# Patient Record
Sex: Male | Born: 1960 | Race: Black or African American | Hispanic: No | Marital: Single | Smoking: Current every day smoker
Health system: Southern US, Community
[De-identification: ages and names within clinical notes are randomized; demographics above are authoritative.]

## PROBLEM LIST (undated history)

## (undated) DIAGNOSIS — I219 Acute myocardial infarction, unspecified: Secondary | ICD-10-CM

## (undated) DIAGNOSIS — R351 Nocturia: Secondary | ICD-10-CM

## (undated) DIAGNOSIS — M1990 Unspecified osteoarthritis, unspecified site: Secondary | ICD-10-CM

## (undated) DIAGNOSIS — C189 Malignant neoplasm of colon, unspecified: Secondary | ICD-10-CM

## (undated) DIAGNOSIS — F32A Depression, unspecified: Secondary | ICD-10-CM

## (undated) DIAGNOSIS — Z973 Presence of spectacles and contact lenses: Secondary | ICD-10-CM

## (undated) DIAGNOSIS — I10 Essential (primary) hypertension: Secondary | ICD-10-CM

## (undated) DIAGNOSIS — A159 Respiratory tuberculosis unspecified: Secondary | ICD-10-CM

## (undated) DIAGNOSIS — Q8500 Neurofibromatosis, unspecified: Secondary | ICD-10-CM

## (undated) DIAGNOSIS — H9190 Unspecified hearing loss, unspecified ear: Secondary | ICD-10-CM

## (undated) DIAGNOSIS — F329 Major depressive disorder, single episode, unspecified: Secondary | ICD-10-CM

## (undated) DIAGNOSIS — N529 Male erectile dysfunction, unspecified: Secondary | ICD-10-CM

## (undated) DIAGNOSIS — H579 Unspecified disorder of eye and adnexa: Secondary | ICD-10-CM

## (undated) DIAGNOSIS — H548 Legal blindness, as defined in USA: Secondary | ICD-10-CM

## (undated) DIAGNOSIS — R296 Repeated falls: Secondary | ICD-10-CM

## (undated) DIAGNOSIS — IMO0001 Reserved for inherently not codable concepts without codable children: Secondary | ICD-10-CM

## (undated) DIAGNOSIS — Z970 Presence of artificial eye: Secondary | ICD-10-CM

## (undated) HISTORY — PX: ABDOMINAL SURGERY: SHX537

## (undated) HISTORY — PX: HERNIA REPAIR: SHX51

## (undated) HISTORY — PX: EYE SURGERY: SHX253

## (undated) HISTORY — DX: Male erectile dysfunction, unspecified: N52.9

## (undated) HISTORY — PX: CARDIAC CATHETERIZATION: SHX172

## (undated) HISTORY — DX: Essential (primary) hypertension: I10

---

## 2002-07-31 ENCOUNTER — Ambulatory Visit (HOSPITAL_COMMUNITY): Admission: RE | Admit: 2002-07-31 | Discharge: 2002-07-31 | Payer: Self-pay | Admitting: Family Medicine

## 2002-07-31 ENCOUNTER — Encounter: Payer: Self-pay | Admitting: Family Medicine

## 2002-11-28 ENCOUNTER — Ambulatory Visit (HOSPITAL_COMMUNITY): Admission: RE | Admit: 2002-11-28 | Discharge: 2002-11-28 | Payer: Self-pay | Admitting: Gastroenterology

## 2003-01-03 ENCOUNTER — Encounter: Payer: Self-pay | Admitting: Family Medicine

## 2003-01-03 ENCOUNTER — Ambulatory Visit (HOSPITAL_COMMUNITY): Admission: RE | Admit: 2003-01-03 | Discharge: 2003-01-03 | Payer: Self-pay | Admitting: Family Medicine

## 2003-05-15 ENCOUNTER — Ambulatory Visit (HOSPITAL_COMMUNITY): Admission: RE | Admit: 2003-05-15 | Discharge: 2003-05-15 | Payer: Self-pay | Admitting: Family Medicine

## 2003-06-06 ENCOUNTER — Ambulatory Visit (HOSPITAL_COMMUNITY): Admission: RE | Admit: 2003-06-06 | Discharge: 2003-06-06 | Payer: Self-pay | Admitting: Family Medicine

## 2003-06-11 ENCOUNTER — Ambulatory Visit (HOSPITAL_COMMUNITY): Admission: RE | Admit: 2003-06-11 | Discharge: 2003-06-11 | Payer: Self-pay | Admitting: Neurosurgery

## 2003-07-21 ENCOUNTER — Encounter (INDEPENDENT_AMBULATORY_CARE_PROVIDER_SITE_OTHER): Payer: Self-pay | Admitting: Specialist

## 2003-07-21 ENCOUNTER — Ambulatory Visit (HOSPITAL_BASED_OUTPATIENT_CLINIC_OR_DEPARTMENT_OTHER): Admission: RE | Admit: 2003-07-21 | Discharge: 2003-07-21 | Payer: Self-pay | Admitting: General Surgery

## 2003-07-21 ENCOUNTER — Ambulatory Visit (HOSPITAL_COMMUNITY): Admission: RE | Admit: 2003-07-21 | Discharge: 2003-07-21 | Payer: Self-pay | Admitting: General Surgery

## 2003-09-16 ENCOUNTER — Ambulatory Visit (HOSPITAL_BASED_OUTPATIENT_CLINIC_OR_DEPARTMENT_OTHER): Admission: RE | Admit: 2003-09-16 | Discharge: 2003-09-16 | Payer: Self-pay | Admitting: General Surgery

## 2003-09-16 ENCOUNTER — Ambulatory Visit (HOSPITAL_COMMUNITY): Admission: RE | Admit: 2003-09-16 | Discharge: 2003-09-16 | Payer: Self-pay | Admitting: General Surgery

## 2003-09-16 ENCOUNTER — Encounter (INDEPENDENT_AMBULATORY_CARE_PROVIDER_SITE_OTHER): Payer: Self-pay | Admitting: Specialist

## 2004-08-16 ENCOUNTER — Inpatient Hospital Stay (HOSPITAL_COMMUNITY): Admission: EM | Admit: 2004-08-16 | Discharge: 2004-08-21 | Payer: Self-pay | Admitting: Emergency Medicine

## 2004-08-17 ENCOUNTER — Encounter (INDEPENDENT_AMBULATORY_CARE_PROVIDER_SITE_OTHER): Payer: Self-pay | Admitting: *Deleted

## 2004-08-25 ENCOUNTER — Encounter: Payer: Self-pay | Admitting: General Surgery

## 2004-08-31 ENCOUNTER — Ambulatory Visit (HOSPITAL_COMMUNITY): Admission: RE | Admit: 2004-08-31 | Discharge: 2004-08-31 | Payer: Self-pay | Admitting: *Deleted

## 2004-10-04 ENCOUNTER — Encounter (HOSPITAL_COMMUNITY): Admission: RE | Admit: 2004-10-04 | Discharge: 2005-01-02 | Payer: Self-pay | Admitting: Endocrinology

## 2004-10-12 ENCOUNTER — Ambulatory Visit (HOSPITAL_COMMUNITY): Admission: RE | Admit: 2004-10-12 | Discharge: 2004-10-12 | Payer: Self-pay | Admitting: Endocrinology

## 2004-11-01 ENCOUNTER — Emergency Department (HOSPITAL_COMMUNITY): Admission: EM | Admit: 2004-11-01 | Discharge: 2004-11-01 | Payer: Self-pay | Admitting: *Deleted

## 2005-02-02 ENCOUNTER — Encounter: Admission: RE | Admit: 2005-02-02 | Discharge: 2005-02-02 | Payer: Self-pay | Admitting: *Deleted

## 2006-09-25 ENCOUNTER — Encounter: Admission: RE | Admit: 2006-09-25 | Discharge: 2006-09-25 | Payer: Self-pay | Admitting: Family Medicine

## 2010-01-26 ENCOUNTER — Ambulatory Visit (HOSPITAL_BASED_OUTPATIENT_CLINIC_OR_DEPARTMENT_OTHER): Admission: RE | Admit: 2010-01-26 | Discharge: 2010-01-26 | Payer: Self-pay | Admitting: Surgery

## 2010-02-19 ENCOUNTER — Ambulatory Visit (HOSPITAL_COMMUNITY): Admission: RE | Admit: 2010-02-19 | Discharge: 2010-02-19 | Payer: Self-pay | Admitting: Ophthalmology

## 2010-09-03 NOTE — Discharge Summary (Signed)
NAME:  Eric Day, Eric Day NO.:  [id number]   MEDICAL RECORD NO.:  [id number]          PATIENT TYPE:  INP   LOCATION:  6715                         FACILITY:  [facility]   PHYSICIAN:  Eric Quarry, MD      DATE OF BIRTH:  09/16/1960   DATE OF ADMISSION:  08/16/2004  DATE OF DISCHARGE:  08/21/2004                                 DISCHARGE SUMMARY   Eric Day is a 50 year old male who initially presented to [facility] on May 1 with sudden onset of severe, right upper quadrant,  abdominal pain associated with shortness of breath. There was no associated  nausea, vomiting, or change in bowel habits. There was no chest pain. There  was no fever, chills, cough, or chest congestion. The patient did not notice  any lower extremity swelling, leg or calf pain. In the emergency room, the  patient was noted to be acute distress. Radiologic studies were performed  which included a chest x-ray that showed evidence of bilateral atelectasis.  A CT scan of the chest was done which showed no filling defects and no  evidence of pulmonary emboli. Bilateral lower lobe atelectasis was noted,  and it was suggested that perhaps the patient had underlying pneumonia. A CT  scan of the abdomen was performed which showed an enhancing complex mass in  the right adrenal gland which did not appear to be a benign adenoma. At the  request of the radiologist, a MRI scan of the abdomen was done which showed  that the right adrenal mass was 5.1 x 3.7 x 3 cm in size with evidence of  edema and hemorrhage which extended under the undersurface of the liver.  Also a 1-cm lesion was identified in the left adrenal gland. Laboratory  studies were obtained which included a CMP which was notable for potassium  of 3.3 and glucose of 183. Serial cardiac enzymes showed that the troponin  level rose progressively to 0.39. The patient was admitted by Dr. Cloyd Stagers-  Day.   PHYSICAL EXAMINATION:  VITAL SIGNS:  On  physical exam, his temperature was  98, blood pressure 150/80, pulse 79, respirations 20.  HEENT:  Within normal limits.  CHEST:  The chest was remarkable for fascicular breath sounds which were  described as decreased at the bases.  CARDIOVASCULAR:  Revealed normal S1 and S2. There were no rubs, murmurs, or  gallops.  ABDOMEN:  Remarkable for marked tenderness in the right upper quadrant area.  There was tenderness to deep palpation. There did not seem to be any  guarding or rebound.  NEUROLOGICAL/EXTREMITIES:  Remarkable for the present of several  neurofibromas.   On admission, the patient was administered pain medication in the form of  morphine by the intravenous root. Potassium supplementation was  administered, and consultation was obtained from Dr. Meade Day of the  cardiology service. She felt that as the patient had evidence of active  hemorrhage that it was not appropriate at this time to proceed with any  cardiac evaluation. She suggested placing the patient on a beta blocker and  monitoring his cardiovascular and blood pressure status closely. Dr. Derrell Day  of the surgical service was consulted in regard to the adrenal mass who  recommended following the patient and did not think that it was appropriate  to intervene surgically at this time. He and I discussed the finding. We  both agreed that the patient needed to have an endocrinology consult in  regard to the adrenal mass.   Subsequently, relevant laboratory studies obtained included an a.m. cortisol  level of 24.8 and urine for catecholamines. This study revealed a  norepinephrine value of 415 mcg per day, an epinephrine value of 162 mcg per  day, and a dopamine value of 286 mcg per day. These results were elevated.  During the course of the patient's hospitalization, I spoke with Dr. Talmage Day  of [facility] who agreed to perform an endocrine consult on the  patient as an outpatient. The patient was  supplied with Dr. Willeen Day number,  and Dr. Talmage Day indicated that she would be sure that the patient got seen in  an expeditious manner. By May 6, the patient was pain free and felt quite  well. He was eager to be discharged. Therefore, the patient was discharged  at that time.   DISCHARGE DIAGNOSES:  1.  History of neurofibromatosis.  2.  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  3.  Abnormal urine catecholamines. Cannot rule out pheochromocytoma.  4.  Bilateral atelectasis. Cannot rule out pneumonia.   PLAN:  At the time of discharge, I advised the patient to continue Avelox  400 mg orally for three additional days. The patient had been receiving  metoprolol 25 mg b.i.d., but I was uncomfortable with continuing this  medication in light of the possibility that the patient had an underlying  pheochromocytoma. I therefore switched him to Cardizem at a dose of 180 mg  daily. I repeated emphasized to the patient the extreme importance of seeing  Dr. Talmage Day in the office for further evaluation.      SY/MEDQ  D:  08/21/2004  T:  08/21/2004  Job:  [id number]   cc:   [facility] Day, M.D.  [address], [place] [address]  Fax: [phone]   [facility], M.D.  [address]  [place]  [place] [address]  Fax: [phone]   [facility], M.D.  [facility].

## 2010-09-03 NOTE — Cardiovascular Report (Signed)
NAMETARAN, HAYNESWORTH NO.:  [id number]   MEDICAL RECORD NO.:  [id number]          PATIENT TYPE:  OIB   LOCATION:  2899                         FACILITY:  [facility]   PHYSICIAN:  Meade Maw, M.D.    DATE OF BIRTH:  May 13, 1960   DATE OF PROCEDURE:  08/31/2004  DATE OF DISCHARGE:                              CARDIAC CATHETERIZATION   REFERRING PHYSICIAN:  Dr. De Nurse.   INDICATIONS FOR PROCEDURE:  Ongoing chest pain, positive troponins, ST  changes noted on the ECG.   PROCEDURE:  After obtaining written informed consent, the patient was  brought to the cardiac catheterization lab in a postabsorptive state.  Preop  sedation was achieved using Versed 10 mg IV.  The right groin was prepped  and draped in the usual sterile fashion.  Local anesthesia was achieved  using 1% Xylocaine.  A 6-French hemostasis sheath was placed into the right  femoral artery using a modified Seldinger technique.  Selective coronary  angiography was performed using a JL4 and JR4 Judkins catheter.  Multiple  views were obtained.  All catheter exchanges were made over a guidewire.  Single-plane ventriculogram was performed in the RAO position using a 6-  French pigtail curved catheter.  Selective coronary angiography was  performed using a JL4 and JR-4 Judkins catheter.  All catheter exchanges  were made over a guidewire.  The hemostasis sheath was flushed following  each catheter exchange.   FINDINGS:  The aortic pressure was 100/62.  LV pressure is 102/8.  EDP is  14.   SINGLE-PLANE VENTRICULOGRAM:  Revealed normal wall motion, ejection fraction  of 65% to 70%.   CORONARY ANGIOGRAPHY:  The left main coronary artery bifurcates into the  left anterior descending and circumflex vessel.  There is no disease noted  in the left main coronary artery.   Left anterior descending:  The left anterior descending gives rise to a  first large diagonal, smaller second and third diagonals, goes  on to end as  an apical branch.  There is no disease noted in the left anterior descending  or its branches.   Circumflex:  The circumflex vessel is a large vessel that gives rise to a  small OM-1, a moderate OM-2, a large bifurcating OM-3.  There is no disease  noted in the circumflex or its branches.   Right coronary artery:  The right coronary artery is dominant for the  posterior circulation and gives rise to 3 RV marginals, a small PDA and  small PLV branch.  There is no disease noted in the right coronary artery or  its branches.   FINAL IMPRESSION:  1.  Normal coronary angiography.  2.  Normal single-plane ventriculogram, ejection fraction of 65%.  3.  ECG changes and elevation in troponins may be secondary to hypertension.      No further cardiac workup is recommended.      HP/MEDQ  D:  08/31/2004  T:  08/31/2004  Job:  [id number]

## 2010-09-03 NOTE — Consult Note (Signed)
NAME:  [facility], Eric Day NO.:  [id number]   MEDICAL RECORD NO.:  [id number]          PATIENT TYPE:  INP   LOCATION:  6715                         FACILITY:  [facility]   PHYSICIAN:  Angelia Mould. Derrell Lolling, M.D.DATE OF BIRTH:  1960/05/16   DATE OF CONSULTATION:  08/17/2004  DATE OF DISCHARGE:                                   CONSULTATION   REASON FOR CONSULTATION:  Evaluate right adrenal mass.   HISTORY OF PRESENT ILLNESS:  This is a 50 year old black man who was in his  usual state of relatively good health when he noted the sudden onset of  right upper quadrant pain Sunday evening, August 15, 2004.  He endured this  for several hours but then came to the [facility]  Emergency Room and was admitted.  He denies any nausea, vomiting, or fever.  He denies prior episodes.  He has been anorexic.  He denies cough or any  substernal chest pain but has had some right lower chest pain and right  upper quadrant pain.  He states that he still has the pain today and is  still anorexic today.   On admission, he had numerous imaging studies including a CT angiogram of  the chest, a CT scan of the abdomen and MRI.  These studies show a 3.8 cm  right adrenal mass with adjacent hemorrhage and some partial compression of  the inferior vena cava.  This is not an extensive hemorrhage, however.  A  smaller, approximately 1.0 cm, left adrenal mass is also noted.  The mass in  the right adrenal is thought to be enhancing on the studies.  There is no  evidence of any pulmonary embolism.   Patient also has been noted to have elevated cardiac enzymes and he was  initially placed on Lovenox and heparin but when the CT scan showed some  hemorrhage around the adrenal, that was discontinued.  He also has been  noted to have some borderline hypertension that is not very significant,  thought to be due to pain.   I was asked to see him to assist with decisions regarding work-up of  his  adrenal mass and decisions regarding surgical intervention.   PAST MEDICAL HISTORY:  1.  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      He was told this was a rare tumor and that he was cancer-free.  2.  He has been told he has some type of brain tumor on CT scan but that      this is probably not cancer and does not require surgery.  3.  He has had some cutaneous neurofibromas removed from his back but has      not had any thoracic or abdominal neurofibromas.  4.  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   CURRENT MEDICATIONS:  He does not take any medications at home.   ALLERGIES:  No known drug allergies.   FAMILY HISTORY:  Negative for heart disease.   SOCIAL HISTORY:  He smokes a  half pack of cigarettes per day.  Denies the  use of alcohol.  Single.  His mother is his closest relative.  He attended  special education high school.   REVIEW OF SYMPTOMS:  All systems reviewed.  They were noncontributory except  that as described above.   PHYSICAL EXAMINATION:  GENERAL APPEARANCE:  A middle-aged black man in mild  distress.  Very flattened affect.  Slow conversationalist but cooperative  and appropriate.  VITAL SIGNS:  Temperature 100.0, blood pressure 124/65, heart rate 84,  respiratory rate 18, oxygen saturation 94% on room air.  Weight 188 pounds.  HEENT:  Eyes:  He has a false right eye.  Extraocular movements of the left  eye are normal.  There is no exophthalmus.  Ears, nose, mouth, throat, lips,  tongue and oropharynx are without gross lesions.  NECK:  Supple and nontender.  No mass, no jugular venous distension.  CARDIOVASCULAR:  Regular rate and rhythm, no murmurs noted.  ABDOMEN:  Slight obesity.  Well-healed midline scar.  No hernias.  He is  tender in the right upper quadrant but with minimal guarding, no rebound.  The rest of the abdomen is soft and nontender.  Liver and spleen are not  obvious enlarged.   No hernia or mass.  EXTREMITIES:  He moves all four extremities well without pain or deformity.  There is no peripheral edema.   LABORATORY DATA:  Hemoglobin 15.9, Bralley blood cell count 15,600. Sodium  141, potassium 5.2, BUN 7, creatinine 1.2, glucose 122. Liver function tests  normal.  Lipase 29.  INR 1.1.   ASSESSMENT:  1.  Acute abdominal pain secondary to spontaneous hemorrhage within a 3.8 cm      enhancing right adrenal mass.  2.  A 1.0 cm left adrenal mass.  3.  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  4.  History of cutaneous neurofibromatosis.  5.  Abnormal cardiac enzymes.  6.  Question of right middle lobe pneumonia.   PLAN:  1.  At this point in time, there is no indication for emergent surgical      intervention. This may be required at a later date.  2.  I would proceed with obtaining an endocrine consult now to begin to      contemplate future work-up.  He will ultimately need blood and urine      studies and an MIBG scan to rule out pheochromocytoma or other      hormonally active tumors.  I would hold off on these tests for three to      four weeks until the hemorrhage and pain had resolved.  3.  Other considerations would be metastatic gastric cancer or lipid poor      adenoma.  If his endocrine work-up is negative for secretory tumor, then      a needle biopsy could be considered at a later date.   I will obtain records of his gastrectomy in 1999.   I will follow along with you with this interesting case.      HMI/MEDQ  D:  08/17/2004  T:  08/17/2004  Job:  [id number]   cc:   Deatra Shantanu, M.D.  Fax: [phone]   Meade Maw, M.D.  [address]  [place]  [place] [address]  Fax: [phone]

## 2010-09-03 NOTE — Op Note (Signed)
   NAME:  [facility], Eric Day                           ACCOUNT NO.:  [id number]   MEDICAL RECORD NO.:  [id number]                   PATIENT TYPE:  AMB   LOCATION:  ENDO                                 FACILITY:  [facility]   PHYSICIAN:  John C. Madilyn Fireman, M.D.                 DATE OF BIRTH:  1960-06-01   DATE OF PROCEDURE:  11/28/2002  DATE OF DISCHARGE:                                 OPERATIVE REPORT   PROCEDURE:  Colonoscopy.   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   DESCRIPTION OF PROCEDURE:  The patient was placed in the left lateral  decubitus position and placed on the pulse monitor with continuous low-flow  oxygen delivered by nasal cannula.  He was sedated with 60 mcg IV fentanyl  and 8 mg IV Versed.  The Olympus video colonoscope was inserted into the  rectum and advanced easily to a surgical ileocolonic anastomosis in the  right abdomen.  The anastomosis appeared to be an end-to-end anastomosis,  and it was intact with no visible suspicion of neoplasm or significant  inflammatory process.  The remaining right colon distal to the anastomosis  as well as the transverse, descending, sigmoid, and rectum appeared normal  down to the anus, where retroflexed view did reveal some small internal  hemorrhoids with no stigma of hemorrhage.  The scope was then withdrawn and  the patient returned to the recovery room in stable condition.  He tolerated  the procedure well, and there were no immediate complications.   IMPRESSION:  Internal hemorrhoids, otherwise basically normal colonoscopy  status post previous right colon resection.   PLAN:  Treat hemorrhoids symptomatically.                                               John C. Madilyn Fireman, M.D.    JCH/MEDQ  D:  11/28/2002  T:  11/28/2002  Job:  [id number]   cc:    Meredith Staggers, M.D.  [address]  [place]  [place] [address]  Fax: [phone]

## 2010-09-03 NOTE — H&P (Signed)
NAME:  [facility], Eric Day NO.:  [id number]   MEDICAL RECORD NO.:  [id number]          PATIENT TYPE:  EMS   LOCATION:  MAJO                         FACILITY:  [facility]   PHYSICIAN:  Jackie Plum, M.D.DATE OF BIRTH:  06-Mar-1961   DATE OF ADMISSION:  08/16/2004  DATE OF DISCHARGE:                                HISTORY & PHYSICAL   CHIEF COMPLAINT:  Dyspnea.   HISTORY OF PRESENT ILLNESS:  Patient is a 50 year old African-American  gentleman with no previous history of diabetes mellitus, hypertension, or  dyslipidemia who presents with above complaint which started last night.  Patient indicates that he was in usual state of health until some time last  night when he started to experience sharp right upper quadrant abdominal  pain with shortness of breath.  The pain in his abdomen does not radiate.  There is no associated nausea, vomiting, diarrhea, constipation.  He has not  been drinking alcohol.  He does not have any chest pain.  He denies any  fever or chills.  He denies any cough or sputum production, PND, orthopnea,  lower extremity swelling, leg or calf pain.  Does not have any dysuria,  frequency, or micturition.  In the emergency room patient had a CT scan of  the abdomen done which was notable for a 3.8 x 2.8 x 4.2 cm right adrenal  enhancing mass, questionable carcinoma.  They recommended follow-up MRI for  further evaluation.  We are therefore asked evaluate the patient for  admission.   PAST MEDICAL HISTORY:  1.  A brain tumor.  2.  Neurofibromatosis.  3.  Internal hemorrhoids.   MEDICATIONS:  Patient is not on any medications at this moment.   ALLERGIES:  Does not have any medication allergies.   FAMILY HISTORY:  Negative for heart disease.   SOCIAL HISTORY:  Patient smokes half a pack of cigarettes daily and does not  drink alcohol.   REVIEW OF SYSTEMS:  Significant positives and negatives as in HPI, otherwise  unremarkable.   PHYSICAL  EXAMINATION:  VITAL SIGNS:  Temperature 98.0, blood pressure  152/80, pulse 79, respirations 20, O2 saturation on 95% on 2-3 L oxygen by  nasal cannula.  GENERAL:  He was in acute painful distress, moderate to severe.  HEENT:  Normocephalic, atraumatic.  Pupils are equal, round, and reactive to  light.  Extraocular movements intact.  Oropharynx moist.  No exudate or  erythema.  NECK:  Supple.  No JVD.  LUNGS:  Vesicular breath sounds which were mildly decreased at the bases.  No crackles or wheezes appreciated.  CARDIAC:  Regular rate and rhythm.  No gallops or murmur.  ABDOMEN:  Patient was guarding in the right upper quadrant and right  subcostal area on palpation.  Could not palpate any masses.  Bowel sounds  were present, normoactive.  He seemed to have some tenderness in that area.  EXTREMITIES:  No cyanosis.  No edema.  CNS:  Patient is alert, oriented x3.  No focal deficits.  SKIN:  He has some numerous skin tags __________ lesions reminiscent of  neurofibromatosis.  IMPRESSION:  1.  Dyspnea.  2.  Abdominal pain/adrenal mass.  3.  Hypokalemia.   Patient will be admitted.  Will get CT scan of the chest to rule out PE.  In  view of patient's acute presentation with dyspnea, I am not sure if this is  related to the adrenal mass or pain causing subjective feeling of dyspnea.  Also, obtain an ABG on this gentleman for further gentleman and will get  follow-up MRI of adrenal mass for better characterization of this lesion.  He may need __________ /endocrine consultation.  We will repeat his  potassium.  Will get a 12-lead EKG and will cycle his enzymes.  Further  recommendations will be made __________ .      GO/MEDQ  D:  08/16/2004  T:  08/16/2004  Job:  [id number]   cc:   Deatra Jarek, M.D.  Fax: [phone]

## 2010-09-03 NOTE — Consult Note (Signed)
[name], [name] NO.:  [id number]   MEDICAL RECORD NO.:  [id number]          PATIENT TYPE:  INP   LOCATION:  6715                         FACILITY:  [facility]   PHYSICIAN:  Meade Maw, M.D.    DATE OF BIRTH:  1960-11-13   DATE OF CONSULTATION:  DATE OF DISCHARGE:                                   CONSULTATION   CONSULTING PHYSICIAN:  Meade Maw, M.D.   REFERRING PHYSICIAN:  Jackie Plum, M.D.   INDICATION FOR CONSULT:  Abnormal cardiac enzymes.   HISTORY:  Eric Day is a 50 year old disabled male who was admitted, on  Aug 16, 2004, for ongoing abdominal pain described as a right upper quadrant  abdominal pain with increasing shortness of breath.  The pain in his abdomen  at the time of admission did not radiate.  There was no associated nausea,  vomiting, diarrhea, or constipation.  There has been no history of alcohol  use.  There was no sputum production.  No fevers.  No pain in his legs.  He  does note that he has been having exertional dyspnea for several months and  the dyspnea has actually prohibited him from climbing stairs.  Since  admission, his ECG has revealed a sinus rhythm to sinus tach.  His initial  ECG revealed 0.5-mm to 1-mm upsloping ST depression in V4-V6.  This has  resolved with no further ST changes noted on Aug 17, 2004.  M.D. reports his  initial cardiac enzymes were negative.  He has subsequently developed an  increase in his troponin I with a maximum troponin I of 0.39 noted.  The  patient was initiated on heparin secondary to ST changes and increase in his  troponin I, and he subsequently developed tachycardia, diaphoresis, and  chest pain.  His MRA revealed bilateral adrenal lesions with the right  greater than left, and the right adrenal gland had hemorrhagic changes  noted.  This extended to the undersurface of the liver and the upper aspect  of the right kidney.  A CT scan of the chest for a pulmonary embolus has  been negative.   PAST MEDICAL HISTORY:  1.  Partial gastrectomy for a rare malignancy in [place], [place] in 1999.  2.  Brain tumor on CT scan.  3.  Cutaneous neurofibromas.  4.  Right eye enucleation.  5.  Internal hemorrhoids.   MEDICATIONS PRIOR TO ADMISSION:  None.   CURRENT MEDICATIONS:  Morphine sulfate p.r.n., Zofran p.r.n., Ritalin p.r.n.   He has no known drug allergies.   FAMILY HISTORY:  Negative for coronary artery disease.  Family history  otherwise negative, the patient is unsure of his family history.   SOCIAL HISTORY:  The patient smokes approximately one half pack of  cigarettes daily.  No history of alcohol use.  He is single.  His mother is  his closest relative.  He has a special education in high school.   PHYSICAL EXAMINATION:  GENERAL:  Reveals a middle-aged male in mild distress  from his pain.  He was sedated at the time of my initial exam.  VITAL SIGNS:  He is afebrile.  Systolic pressure is 150, diastolic 50.  T-  max has been 100.  His heart rate has been elevated to 130s with ambulation  and pain.  NEURO:  Nonfocal.  HEENT:  Unremarkable.  Good carotid upstrokes.  No neck vein distention  noted.  CARDIOVASCULAR:  Reveals a regular rate and rhythm.  Normal S1.  Normal S2  with no rubs, murmurs, or gallops noted.  ABDOMEN:  Tender to palpation.  EXTREMITIES:  Reveal no peripheral edema.   LABORATORY DATA:  As noted above.   IMPRESSION:  1.  Chest pain with abnormal troponin.  His chest pain is atypical for      cardiac.  He is noted to have ST depression on his initial      electrocardiogram of Aug 16, 2004, when he was noted to have a rapid      heart rate.  History would be suggestive somewhat of ischemia.  He does      have coronary risk factors including male sex and tobacco use.      Cholesterol status is unknown.  At this time, we will proceed with a      transthroacic echocardiogram for further evaluation.  I hesitate to      proceed with  Cardiolite at this point as we would be unable to act upon      the results.  We would be unable to provide him with anticoagulation for      any type of percutaneous revascularization.  Should it be determined      that he needs surgery, could proceed with an adenosine Cardiolite to      further risk stratify but again with the notation of knowing that we      would be unable to act on the results.  At this time, I would add beta-      blockers for both hypertension as well as rate control.  He needs close      attention to pain control.  We will add a BNP to his current labs and      continue with serial ECGs for further evaluation.  2.  Hypertension.  Elevation most likely secondary to his pain.   Thank you for allowing me to assist in the care of your patient.  I will  discuss the patient further with you.      HP/MEDQ  D:  08/17/2004  T:  08/17/2004  Job:  [id number]

## 2010-09-03 NOTE — Op Note (Signed)
NAME:  Eric Day, Eric Day                           ACCOUNT NO.:  [id number]   MEDICAL RECORD NO.:  [id number]                   PATIENT TYPE:  AMB   LOCATION:  DSC                                  FACILITY:  [facility]   PHYSICIAN:  Ollen Gross. Vernell Morgans, M.D.              DATE OF BIRTH:  11-17-1960   DATE OF PROCEDURE:  09/16/2003  DATE OF DISCHARGE:                                 OPERATIVE REPORT   PREOPERATIVE DIAGNOSIS:  Neurofibromas.   POSTOPERATIVE DIAGNOSIS:  Neurofibromas.   PROCEDURE:  Excision of 13 neurofibromas from the back, each approximately 5  mm.   SURGEON:  Ollen Gross. Carolynne Edouard, M.D.   DESCRIPTION OF PROCEDURE:  After informed consent was obtained the patient  was brought to the operating room and placed in the prone position on the  operating table.  The left side of his neck was prepped with Betadine and  draped in the usual sterile manner.  Each of these areas was infiltrated  with 1% lidocaine with epinephrine and an 11 blade knife was used to excise  each of these areas. Each area was then closed with interrupted 4-0 Prolene  stitch.  Neosporin and Band-Aids were then applied.  The patient tolerated  the procedure well. At the end of the case, all needle, sponge and  instrument counts were correct.  The patient was then awakened and taken to  the recovery room in stable condition.                                               Ollen Gross. Vernell Morgans, M.D.    PST/MEDQ  D:  09/16/2003  T:  09/16/2003  Job:  [id number]
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
## 2010-11-02 ENCOUNTER — Other Ambulatory Visit (HOSPITAL_COMMUNITY): Payer: Self-pay | Admitting: Family Medicine

## 2010-11-02 DIAGNOSIS — G8929 Other chronic pain: Secondary | ICD-10-CM

## 2010-11-02 DIAGNOSIS — Z85038 Personal history of other malignant neoplasm of large intestine: Secondary | ICD-10-CM

## 2010-11-02 DIAGNOSIS — R1011 Right upper quadrant pain: Secondary | ICD-10-CM

## 2010-11-05 ENCOUNTER — Encounter (HOSPITAL_COMMUNITY): Payer: Self-pay

## 2010-11-05 ENCOUNTER — Ambulatory Visit (HOSPITAL_COMMUNITY)
Admission: RE | Admit: 2010-11-05 | Discharge: 2010-11-05 | Disposition: A | Discharge status: Home / Self Care | Payer: PRIVATE HEALTH INSURANCE | Source: Ambulatory Visit | Attending: Family Medicine | Admitting: Family Medicine

## 2010-11-05 DIAGNOSIS — E278 Other specified disorders of adrenal gland: Secondary | ICD-10-CM | POA: Insufficient documentation

## 2010-11-05 DIAGNOSIS — R1011 Right upper quadrant pain: Secondary | ICD-10-CM | POA: Insufficient documentation

## 2010-11-05 DIAGNOSIS — N400 Enlarged prostate without lower urinary tract symptoms: Secondary | ICD-10-CM | POA: Insufficient documentation

## 2010-11-05 DIAGNOSIS — G8929 Other chronic pain: Secondary | ICD-10-CM

## 2010-11-05 DIAGNOSIS — Z85038 Personal history of other malignant neoplasm of large intestine: Secondary | ICD-10-CM

## 2010-11-05 HISTORY — DX: Malignant neoplasm of colon, unspecified: C18.9

## 2010-11-05 MED ORDER — IOHEXOL 300 MG/ML  SOLN
100.0000 mL | Freq: Once | INTRAMUSCULAR | Status: AC | PRN
  Administered 2010-11-05: 100 mL via INTRAVENOUS

## 2010-11-16 ENCOUNTER — Other Ambulatory Visit: Payer: Self-pay | Admitting: Family Medicine

## 2010-11-16 DIAGNOSIS — R935 Abnormal findings on diagnostic imaging of other abdominal regions, including retroperitoneum: Secondary | ICD-10-CM

## 2010-11-24 ENCOUNTER — Ambulatory Visit
Admission: RE | Admit: 2010-11-24 | Discharge: 2010-11-24 | Disposition: A | Discharge status: Home / Self Care | Payer: PRIVATE HEALTH INSURANCE | Source: Ambulatory Visit | Attending: Family Medicine | Admitting: Family Medicine

## 2010-11-24 DIAGNOSIS — R935 Abnormal findings on diagnostic imaging of other abdominal regions, including retroperitoneum: Secondary | ICD-10-CM

## 2010-11-24 MED ORDER — GADOBENATE DIMEGLUMINE 529 MG/ML IV SOLN
20.0000 mL | Freq: Once | INTRAVENOUS | Status: AC | PRN
  Administered 2010-11-24: 20 mL via INTRAVENOUS

## 2012-03-03 ENCOUNTER — Encounter (HOSPITAL_COMMUNITY): Payer: Self-pay

## 2012-03-03 ENCOUNTER — Emergency Department (HOSPITAL_COMMUNITY)
Admission: EM | Admit: 2012-03-03 | Discharge: 2012-03-03 | Disposition: A | Discharge status: Home / Self Care | Payer: PRIVATE HEALTH INSURANCE | Attending: Emergency Medicine | Admitting: Emergency Medicine

## 2012-03-03 ENCOUNTER — Emergency Department (HOSPITAL_COMMUNITY): Payer: PRIVATE HEALTH INSURANCE

## 2012-03-03 DIAGNOSIS — S025XXA Fracture of tooth (traumatic), initial encounter for closed fracture: Secondary | ICD-10-CM | POA: Insufficient documentation

## 2012-03-03 DIAGNOSIS — Z85038 Personal history of other malignant neoplasm of large intestine: Secondary | ICD-10-CM | POA: Insufficient documentation

## 2012-03-03 DIAGNOSIS — R51 Headache: Secondary | ICD-10-CM | POA: Insufficient documentation

## 2012-03-03 DIAGNOSIS — Z23 Encounter for immunization: Secondary | ICD-10-CM | POA: Insufficient documentation

## 2012-03-03 DIAGNOSIS — S0083XA Contusion of other part of head, initial encounter: Secondary | ICD-10-CM

## 2012-03-03 DIAGNOSIS — S0993XA Unspecified injury of face, initial encounter: Secondary | ICD-10-CM

## 2012-03-03 DIAGNOSIS — S0003XA Contusion of scalp, initial encounter: Secondary | ICD-10-CM | POA: Insufficient documentation

## 2012-03-03 MED ORDER — TETANUS-DIPHTH-ACELL PERTUSSIS 5-2.5-18.5 LF-MCG/0.5 IM SUSP
0.5000 mL | Freq: Once | INTRAMUSCULAR | Status: AC
  Administered 2012-03-03: 0.5 mL via INTRAMUSCULAR
  Filled 2012-03-03: qty 0.5

## 2012-03-03 MED ORDER — IOHEXOL 300 MG/ML  SOLN
80.0000 mL | Freq: Once | INTRAMUSCULAR | Status: AC | PRN
  Administered 2012-03-03: 80 mL via INTRAVENOUS

## 2012-03-03 MED ORDER — HYDROCODONE-ACETAMINOPHEN 5-325 MG PO TABS: 2.0000 | ORAL_TABLET | ORAL | Status: DC | PRN
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
## 2012-03-03 NOTE — ED Notes (Signed)
Rx x 1.  Pt voiced understanding to f/u with dentist and return for worsening condition.
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
## 2013-01-21 ENCOUNTER — Other Ambulatory Visit: Payer: Self-pay | Admitting: Family Medicine

## 2013-01-21 ENCOUNTER — Ambulatory Visit
Admission: RE | Admit: 2013-01-21 | Discharge: 2013-01-21 | Disposition: A | Discharge status: Home / Self Care | Payer: Medicaid Other | Source: Ambulatory Visit | Attending: Family Medicine | Admitting: Family Medicine

## 2013-01-21 DIAGNOSIS — M542 Cervicalgia: Secondary | ICD-10-CM

## 2013-01-21 DIAGNOSIS — M545 Low back pain, unspecified: Secondary | ICD-10-CM

## 2014-06-28 ENCOUNTER — Emergency Department (HOSPITAL_COMMUNITY): Payer: Medicare Other

## 2014-06-28 ENCOUNTER — Encounter (HOSPITAL_COMMUNITY): Payer: Self-pay | Admitting: Family Medicine

## 2014-06-28 ENCOUNTER — Emergency Department (HOSPITAL_COMMUNITY)
Admission: EM | Admit: 2014-06-28 | Discharge: 2014-06-29 | Disposition: A | Discharge status: Home / Self Care | Payer: Medicare Other | Attending: Emergency Medicine | Admitting: Emergency Medicine

## 2014-06-28 DIAGNOSIS — M5412 Radiculopathy, cervical region: Secondary | ICD-10-CM | POA: Diagnosis not present

## 2014-06-28 DIAGNOSIS — Z79899 Other long term (current) drug therapy: Secondary | ICD-10-CM | POA: Insufficient documentation

## 2014-06-28 DIAGNOSIS — R51 Headache: Secondary | ICD-10-CM | POA: Insufficient documentation

## 2014-06-28 DIAGNOSIS — M4802 Spinal stenosis, cervical region: Secondary | ICD-10-CM

## 2014-06-28 DIAGNOSIS — Z85038 Personal history of other malignant neoplasm of large intestine: Secondary | ICD-10-CM | POA: Insufficient documentation

## 2014-06-28 DIAGNOSIS — R200 Anesthesia of skin: Secondary | ICD-10-CM | POA: Diagnosis not present

## 2014-06-28 DIAGNOSIS — R531 Weakness: Secondary | ICD-10-CM | POA: Diagnosis present

## 2014-06-28 DIAGNOSIS — G8220 Paraplegia, unspecified: Secondary | ICD-10-CM

## 2014-06-28 LAB — DIFFERENTIAL
Basophils Absolute: 0 10*3/uL (ref 0.0–0.1)
Basophils Relative: 1 % (ref 0–1)
Eosinophils Absolute: 0.2 10*3/uL (ref 0.0–0.7)
Eosinophils Relative: 2 % (ref 0–5)
Lymphocytes Relative: 34 % (ref 12–46)
Lymphs Abs: 2.7 10*3/uL (ref 0.7–4.0)
MONOS PCT: 10 % (ref 3–12)
Monocytes Absolute: 0.8 10*3/uL (ref 0.1–1.0)
NEUTROS ABS: 4.1 10*3/uL (ref 1.7–7.7)
NEUTROS PCT: 53 % (ref 43–77)

## 2014-06-28 LAB — CBC
HEMATOCRIT: 47.2 % (ref 39.0–52.0)
HEMOGLOBIN: 16.4 g/dL (ref 13.0–17.0)
MCH: 27.9 pg (ref 26.0–34.0)
MCHC: 34.7 g/dL (ref 30.0–36.0)
MCV: 80.4 fL (ref 78.0–100.0)
PLATELETS: 272 10*3/uL (ref 150–400)
RBC: 5.87 MIL/uL — AB (ref 4.22–5.81)
RDW: 14.3 % (ref 11.5–15.5)
WBC: 7.8 10*3/uL (ref 4.0–10.5)

## 2014-06-28 LAB — COMPREHENSIVE METABOLIC PANEL
ALBUMIN: 4 g/dL (ref 3.5–5.2)
ALT: 34 U/L (ref 0–53)
AST: 30 U/L (ref 0–37)
Alkaline Phosphatase: 87 U/L (ref 39–117)
Anion gap: 10 (ref 5–15)
BILIRUBIN TOTAL: 0.7 mg/dL (ref 0.3–1.2)
BUN: 5 mg/dL — AB (ref 6–23)
CO2: 26 mmol/L (ref 19–32)
Calcium: 10 mg/dL (ref 8.4–10.5)
Chloride: 104 mmol/L (ref 96–112)
Creatinine, Ser: 0.97 mg/dL (ref 0.50–1.35)
GFR calc non Af Amer: >90 mL/min (ref >90)
GLUCOSE: 96 mg/dL (ref 70–99)
Potassium: 4 mmol/L (ref 3.5–5.1)
Sodium: 140 mmol/L (ref 135–145)
Total Protein: 6.8 g/dL (ref 6.0–8.3)

## 2014-06-28 LAB — PROTIME-INR
INR: 1.03 (ref 0.00–1.49)
Prothrombin Time: 13.6 seconds (ref 11.6–15.2)

## 2014-06-28 LAB — APTT: APTT: 34 s (ref 24–37)

## 2014-06-28 LAB — I-STAT TROPONIN, ED: Troponin i, poc: 0 ng/mL (ref 0.00–0.08)

## 2014-06-28 MED ORDER — GADOBENATE DIMEGLUMINE 529 MG/ML IV SOLN
18.0000 mL | Freq: Once | INTRAVENOUS | Status: AC | PRN
  Administered 2014-06-28: 18 mL via INTRAVENOUS

## 2014-06-28 MED ORDER — DEXAMETHASONE SODIUM PHOSPHATE 10 MG/ML IJ SOLN
10.0000 mg | Freq: Once | INTRAMUSCULAR | Status: AC
  Administered 2014-06-28: 10 mg via INTRAVENOUS
  Filled 2014-06-28: qty 1

## 2014-06-28 MED ORDER — OXYCODONE-ACETAMINOPHEN 5-325 MG PO TABS: 1.0000 | ORAL_TABLET | ORAL | Status: DC | PRN

## 2014-06-28 MED ORDER — PREDNISONE 50 MG PO TABS: ORAL_TABLET | ORAL | Status: DC

## 2014-06-28 NOTE — ED Provider Notes (Signed)
CSN: [id number]     Arrival date & time 06/28/14  1444 History   First MD Initiated Contact with Patient 06/28/14 1552     Chief Complaint  Patient presents with  . Numbness  . Extremity Weakness     Patient is a 54 y.o. male presenting with extremity weakness. The history is provided by the patient and a relative.  Extremity Weakness This is a new problem. The current episode started more than 2 days ago. The problem occurs daily. The problem has not changed since onset.Associated symptoms include headaches. Pertinent negatives include no chest pain and no abdominal pain. Nothing aggravates the symptoms. Nothing relieves the symptoms.  Patient presents from home with bilateral UE weakness/numbness for a week.  He also reports difficulty walking.  He also reports HA.  No new neck pain.  No CP at this time.   Brother reports pt appears to have slurred speech that was noticed today His brother also feels that he appears unsteady while walking    Past Medical History  Diagnosis Date  . Colon cancer    Past Surgical History  Procedure Laterality Date  . Abdominal surgery    . Eye surgery     History reviewed. No pertinent family history. History  Substance Use Topics  . Smoking status: Never Smoker   . Smokeless tobacco: Not on file  . Alcohol Use: No    Review of Systems  Constitutional: Negative for fever.  Eyes:       Blind at baseline, has prosthetic right eye   Cardiovascular: Negative for chest pain.  Gastrointestinal: Negative for abdominal pain.  Musculoskeletal: Positive for extremity weakness.  Neurological: Positive for weakness, numbness and headaches. Negative for dizziness.  All other systems reviewed and are negative.     Allergies  Review of patient's allergies indicates no known allergies.  Home Medications   Prior to Admission medications   Medication Sig Start Date End Date Taking? Authorizing Provider  HYDROcodone-acetaminophen (NORCO/VICODIN)  5-325 MG per tablet Take 2 tablets by mouth every 4 (four) hours as needed for pain. 03/03/12   Ezequiel Essex, MD  travoprost, benzalkonium, (TRAVATAN) 0.004 % ophthalmic solution Place 1 drop into the left eye 3 (three) times daily.    Historical Provider, MD  traZODone (DESYREL) 100 MG tablet Take 100 mg by mouth at bedtime.    Historical Provider, MD   BP 122/87 mmHg  Pulse 82  Temp(Src) 98.3 F (36.8 C) (Oral)  Resp 21  Ht 5\' 6"  (1.676 m)  Wt 190 lb 4 oz (86.297 kg)  BMI 30.72 kg/m2  SpO2 95% Physical Exam CONSTITUTIONAL: Well developed/well nourished HEAD: Normocephalic/atraumatic EYES: OD prosthetic.   ENMT: Mucous membranes moist NECK: supple no meningeal signs SPINE/BACK:entire spine nontender CV: S1/S2 noted, no murmurs/rubs/gallops noted LUNGS: Lungs are clear to auscultation bilaterally, no apparent distress ABDOMEN: soft, nontender, no rebound or guarding, bowel sounds noted throughout abdomen GU:no cva tenderness NEURO: Pt is awake/alert/appropriate, moves all extremitiesx4.  No facial droop.  No arm/leg drift.  He has decreased grip strength noted bilaterally.  He reports numbness to bilateral forearms.  He has mild ataxia. His speech is fluent EXTREMITIES: pulses normal/equal, full ROM SKIN: warm, color normal, multiple fibromas noted throughout skin PSYCH: no abnormalities of mood noted, alert and oriented to situation  ED Course  Procedures   4:35 PM Pt with numbness, difficulty walking and possible slurred speech for up to a week.  He has h/o neurofibromatosis.  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 Will call nsgy Pt awake/alert, no distress 11:46 PM Dr Saintclair Halsted reviewed imaging Recommends  soft cspine collar Decadron 10mg  IV here Send home with course of steroids and he will see in two days Pt/family agreeable We discussed strict return precautions  Labs Review Labs Reviewed  CBC - Abnormal; Notable for the following:    RBC 5.87 (*)    All other components within normal limits  COMPREHENSIVE METABOLIC PANEL - Abnormal; Notable for the following:    BUN 5 (*)    All other components within normal limits  PROTIME-INR  APTT  DIFFERENTIAL  CBG MONITORING, ED  I-STAT TROPOININ, ED    Imaging Review Ct Head (brain) Wo Contrast  06/28/2014   CLINICAL DATA:  Weakness in both legs started Thursday of last week and bilateral hand numbness  EXAM: CT HEAD WITHOUT CONTRAST  TECHNIQUE: Contiguous axial images were obtained from the base of the skull through the vertex without intravenous contrast.  COMPARISON:  03/03/2012  FINDINGS: Ventricles, cisterns and other CSF spaces are normal. There is no mass, mass effect, shift of midline structures or acute hemorrhage. No evidence of acute infarction. Prosthetic right globe unchanged. Remainder of the exam is unchanged.  IMPRESSION: No acute intracranial findings.   Electronically Signed   By: Marin Olp M.D.   On: 06/28/2014 17:36   Mr Jeri Cos TD Contrast  06/28/2014   CLINICAL DATA:  Progressive lower extremity weakness and difficulty walking for 10 days, predominantly going up stairs. Difficulty with upper extremities, clumsiness. 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  EXAM: MRI HEAD WITHOUT AND WITH CONTRAST  MRI CERVICAL SPINE WITHOUT AND WITH CONTRAST  MRI THORACIC SPINE WITHOUT AND WITH CONTRAST  TECHNIQUE: Multiplanar, multiecho pulse sequences of the brain and surrounding structures, and cervical and thoracic spine, to include the craniocervical junction and cervicothoracic junction, were obtained without and with intravenous contrast.  CONTRAST:  66mL MULTIHANCE GADOBENATE DIMEGLUMINE  529 MG/ML IV SOLN  COMPARISON:  None.  FINDINGS: MRI HEAD FINDINGS  The ventricles and sulci are normal for patient's age. No abnormal parenchymal signal, mass effect. Stable appearance of nonenhancing and RIGHT mesial temporal lobe approximate 15 x 10 mm mass can be seen with myelin vacuolization though typically resolve by adulthood, or fibroma. No abnormal parenchymal enhancement. No reduced diffusion to suggest acute ischemia. Faint subcentimeter T2 hyperintensity within LEFT posterior brachium pontis is unchanged, no associated enhancement. Mild [place] matter changes, similar. No susceptibility artifact to suggest hemorrhage. Is faint intrinsic T1 shortening, associated with susceptibility artifact within the torcula of Herophili extending to the RIGHT transverse sinus is unchanged, and may reflect mineralization or possible fatty lesion.  Moderately motion degraded coronal T1 post gadolinium, severely motion degraded sagittal T1 post gadolinium.  No abnormal extra-axial fluid collections. No extra-axial masses nor leptomeningeal enhancement. Normal major intracranial vascular flow voids seen at the skull base.  Status post RIGHT ocular lens implant. No abnormal sellar expansion. Visualized paranasal sinuses and mastoid air cells are well-aerated. No suspicious calvarial bone marrow signal. No abnormal sellar expansion. Craniocervical junction maintained.  MRI CERVICAL SPINE FINDINGS  Cervical vertebral bodies and posterior elements are intact and aligned with maintenance  of cervical lordosis. Borderline congenital canal narrowing. Intervertebral discs demonstrate normal morphology, decreased T2 signal within all disc consistent with mild desiccation. Mild chronic discogenic endplate changes Q6-7 through C5-6. No STIR signal abnormality to suggest acute osseous process. No abnormal intradiscal enhancement. Mild enhancement about the C3-4 facets associated with effusion and reactive arthropathy.  10 mm segment of  mildly bright T2 signal within the cervical spinal cord at C3-4 associated with canal stenosis. No syrinx. No suspicious cord, leptomeningeal or epidural enhancement. 22 x 21 mm enhancing subcutaneous lesion within the paraspinal soft tissues at the cranial cervical junction most consistent with neurofibroma plaque.  Level by level evaluation (moderate to severely motion degraded axial T1 and T2 sequences limit evaluation.):  C2-3: Annular bulging, uncovertebral hypertrophy and severe facet arthropathy result in mild canal stenosis, severe RIGHT and moderate to severe LEFT neural foraminal narrowing.  C3-4: 2 mm broad-based disc bulge, uncovertebral hypertrophy and severe facet arthropathy result in moderate to severe canal stenosis, AP dimension of the canal is 8 mm wake. Severe RIGHT, moderate to severe LEFT neural foraminal narrowing.  C4-5: 1-2 mm annular bulging, uncovertebral hypertrophy. Severe facet arthropathy results in moderate canal stenosis. Severe RIGHT greater LEFT neural foraminal narrowing.  C5-6: Annular bulging, uncovertebral hypertrophy and moderate to severe RIGHT greater LEFT facet arthropathy result in mild canal stenosis, severe bilateral neural foraminal narrowing.  C6-7: No disc bulge. Uncovertebral hypertrophy and mild facet arthropathy results in moderate to severe neural foraminal narrowing without canal stenosis.  C7-T1: No disc bulge. Uncovertebral hypertrophy and mild facet arthropathy without canal stenosis. Mild neural foraminal narrowing.  MRI THORACIC SPINE FINDINGS  Thoracic vertebral bodies and posterior elements are intact and aligned with maintenance of thoracic kyphosis. Mild congenital canal narrowing on the basis of foreshortened pedicles. Mild upper to mid thoracic epidural lipomatosis. Intervertebral discs demonstrate normal morphology, slight desiccation. No STIR signal to suggest acute osseous process. No abnormal osseous or intradiscal enhancement.  Thoracic spinal  cord appears overall normal morphology and signal characteristics though motion degrades sensitivity for subtle potential parenchymal cord signal abnormality. No abnormal cord, leptomeningeal or epidural enhancement. Better imaged on cervical spine is a LEFT paraspinal subcutaneous fibroma.  Multilevel mild facet arthropathy and ligamentum flavum redundancy without canal stenosis. No definite neural foraminal narrowing the motion degrades sensitivity.  IMPRESSION: MRI HEAD: No acute intracranial process.  Nonspecific [place] matter changes, some of which may reflect sequelae of NF-1 without suspicious enhancement to suggest malignant degeneration.  MRI CERVICAL SPINE:  Motion degraded examination.  Degenerative change of the cervical spine superimposed on borderline congenital canal narrowing results in moderate to severe canal stenosis at C3-4, cord compression with 10 mm segment of nonenhancing cord edema/ pre syrinx.  Moderate canal stenosis at C4-5, mild at C2-3 and C5-6. Neural foraminal narrowings at all cervical levels: Severe from C2-3 through C5-6.  LEFT paraspinal subcutaneous neurofibroma.  MRI THORACIC SPINE:  Motion degraded examination.  Early degenerative change of thoracic spine superimposed on congenital canal narrowing without acute fracture, malalignment or nerve compressive changes. No suspicious thoracic spine enhancement.  Acute findings discussed with and reconfirmed by Dr.Danta Baumgardner on 06/28/2014 at 10:50 pm.   Electronically Signed   By: Elon Alas   On: 06/28/2014 22:54   Mr Cervical Spine W Wo Contrast  06/28/2014   CLINICAL DATA:  Progressive lower extremity weakness and difficulty walking for 10 days, predominantly going up stairs. Difficulty with upper extremities, clumsiness. 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  EXAM: MRI HEAD WITHOUT AND WITH CONTRAST  MRI CERVICAL SPINE WITHOUT AND WITH CONTRAST  MRI THORACIC SPINE WITHOUT  AND WITH CONTRAST  TECHNIQUE: Multiplanar, multiecho pulse sequences of the brain and surrounding structures, and cervical and thoracic spine, to include the craniocervical junction and cervicothoracic junction, were obtained without and with intravenous contrast.  CONTRAST:  16mL MULTIHANCE GADOBENATE DIMEGLUMINE 529 MG/ML IV SOLN  COMPARISON:  None.  FINDINGS: MRI HEAD FINDINGS  The ventricles and sulci are normal for patient's age. No abnormal parenchymal signal, mass effect. Stable appearance of nonenhancing and RIGHT mesial temporal lobe approximate 15 x 10 mm mass can be seen with myelin vacuolization though typically resolve by adulthood, or fibroma. No abnormal parenchymal enhancement. No reduced diffusion to suggest acute ischemia. Faint subcentimeter T2 hyperintensity within LEFT posterior brachium pontis is unchanged, no associated enhancement. Mild [place] matter changes, similar. No susceptibility artifact to suggest hemorrhage. Is faint intrinsic T1 shortening, associated with susceptibility artifact within the torcula of Herophili extending to the RIGHT transverse sinus is unchanged, and may reflect mineralization or possible fatty lesion.  Moderately motion degraded coronal T1 post gadolinium, severely motion degraded sagittal T1 post gadolinium.  No abnormal extra-axial fluid collections. No extra-axial masses nor leptomeningeal enhancement. Normal major intracranial vascular flow voids seen at the skull base.  Status post RIGHT ocular lens implant. No abnormal sellar expansion. Visualized paranasal sinuses and mastoid air cells are well-aerated. No suspicious calvarial bone marrow signal. No abnormal sellar expansion. Craniocervical junction maintained.  MRI CERVICAL SPINE FINDINGS  Cervical vertebral bodies and posterior elements are intact and aligned with maintenance of cervical lordosis. Borderline congenital canal narrowing. Intervertebral discs demonstrate normal morphology, decreased T2 signal  within all disc consistent with mild desiccation. Mild chronic discogenic endplate changes Z6-1 through C5-6. No STIR signal abnormality to suggest acute osseous process. No abnormal intradiscal enhancement. Mild enhancement about the C3-4 facets associated with effusion and reactive arthropathy.  10 mm segment of mildly bright T2 signal within the cervical spinal cord at C3-4 associated with canal stenosis. No syrinx. No suspicious cord, leptomeningeal or epidural enhancement. 22 x 21 mm enhancing subcutaneous lesion within the paraspinal soft tissues at the cranial cervical junction most consistent with neurofibroma plaque.  Level by level evaluation (moderate to severely motion degraded axial T1 and T2 sequences limit evaluation.):  C2-3: Annular bulging, uncovertebral hypertrophy and severe facet arthropathy result in mild canal stenosis, severe RIGHT and moderate to severe LEFT neural foraminal narrowing.  C3-4: 2 mm broad-based disc bulge, uncovertebral hypertrophy and severe facet arthropathy result in moderate to severe canal stenosis, AP dimension of the canal is 8 mm wake. Severe RIGHT, moderate to severe LEFT neural foraminal narrowing.  C4-5: 1-2 mm annular bulging, uncovertebral hypertrophy. Severe facet arthropathy results in moderate canal stenosis. Severe RIGHT greater LEFT neural foraminal narrowing.  C5-6: Annular bulging, uncovertebral hypertrophy and moderate to severe RIGHT greater LEFT facet arthropathy result in mild canal stenosis, severe bilateral neural foraminal narrowing.  C6-7: No disc bulge. Uncovertebral hypertrophy and mild facet arthropathy results in moderate to severe neural foraminal narrowing without canal stenosis.  C7-T1: No disc bulge. Uncovertebral hypertrophy and mild facet arthropathy without canal stenosis. Mild neural foraminal narrowing.  MRI THORACIC SPINE FINDINGS  Thoracic vertebral bodies and posterior elements are intact and aligned with maintenance of thoracic  kyphosis. Mild congenital canal narrowing on the basis of foreshortened pedicles. Mild upper to mid thoracic epidural lipomatosis. Intervertebral discs demonstrate normal morphology, slight  desiccation. No STIR signal to suggest acute osseous process. No abnormal osseous or intradiscal enhancement.  Thoracic spinal cord appears overall normal morphology and signal characteristics though motion degrades sensitivity for subtle potential parenchymal cord signal abnormality. No abnormal cord, leptomeningeal or epidural enhancement. Better imaged on cervical spine is a LEFT paraspinal subcutaneous fibroma.  Multilevel mild facet arthropathy and ligamentum flavum redundancy without canal stenosis. No definite neural foraminal narrowing the motion degrades sensitivity.  IMPRESSION: MRI HEAD: No acute intracranial process.  Nonspecific [place] matter changes, some of which may reflect sequelae of NF-1 without suspicious enhancement to suggest malignant degeneration.  MRI CERVICAL SPINE:  Motion degraded examination.  Degenerative change of the cervical spine superimposed on borderline congenital canal narrowing results in moderate to severe canal stenosis at C3-4, cord compression with 10 mm segment of nonenhancing cord edema/ pre syrinx.  Moderate canal stenosis at C4-5, mild at C2-3 and C5-6. Neural foraminal narrowings at all cervical levels: Severe from C2-3 through C5-6.  LEFT paraspinal subcutaneous neurofibroma.  MRI THORACIC SPINE:  Motion degraded examination.  Early degenerative change of thoracic spine superimposed on congenital canal narrowing without acute fracture, malalignment or nerve compressive changes. No suspicious thoracic spine enhancement.  Acute findings discussed with and reconfirmed by Robbie Louis on 06/28/2014 at 10:50 pm.   Electronically Signed   By: Elon Alas   On: 06/28/2014 22:54   Mr Thoracic Spine W Wo Contrast  06/28/2014   CLINICAL DATA:  Progressive lower extremity  weakness and difficulty walking for 10 days, predominantly going up stairs. Difficulty with upper extremities, clumsiness. 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  EXAM: MRI HEAD WITHOUT AND WITH CONTRAST  MRI CERVICAL SPINE WITHOUT AND WITH CONTRAST  MRI THORACIC SPINE WITHOUT AND WITH CONTRAST  TECHNIQUE: Multiplanar, multiecho pulse sequences of the brain and surrounding structures, and cervical and thoracic spine, to include the craniocervical junction and cervicothoracic junction, were obtained without and with intravenous contrast.  CONTRAST:  73mL MULTIHANCE GADOBENATE DIMEGLUMINE 529 MG/ML IV SOLN  COMPARISON:  None.  FINDINGS: MRI HEAD FINDINGS  The ventricles and sulci are normal for patient's age. No abnormal parenchymal signal, mass effect. Stable appearance of nonenhancing and RIGHT mesial temporal lobe approximate 15 x 10 mm mass can be seen with myelin vacuolization though typically resolve by adulthood, or fibroma. No abnormal parenchymal enhancement. No reduced diffusion to suggest acute ischemia. Faint subcentimeter T2 hyperintensity within LEFT posterior brachium pontis is unchanged, no associated enhancement. Mild [place] matter changes, similar. No susceptibility artifact to suggest hemorrhage. Is faint intrinsic T1 shortening, associated with susceptibility artifact within the torcula of Herophili extending to the RIGHT transverse sinus is unchanged, and may reflect mineralization or possible fatty lesion.  Moderately motion degraded coronal T1 post gadolinium, severely motion degraded sagittal T1 post gadolinium.  No abnormal extra-axial fluid collections. No extra-axial masses nor leptomeningeal enhancement. Normal major intracranial vascular flow voids seen at the skull base.  Status post RIGHT ocular lens implant. No abnormal sellar expansion. Visualized paranasal sinuses and mastoid air cells are well-aerated. No suspicious calvarial bone marrow  signal. No abnormal sellar expansion. Craniocervical junction maintained.  MRI CERVICAL SPINE FINDINGS  Cervical vertebral bodies and posterior elements are intact and aligned with maintenance of cervical lordosis. Borderline congenital canal narrowing. Intervertebral discs demonstrate normal morphology, decreased T2 signal within all disc consistent with mild desiccation. Mild chronic discogenic endplate changes Q6-7 through C5-6. No STIR signal abnormality to suggest acute osseous process.  No abnormal intradiscal enhancement. Mild enhancement about the C3-4 facets associated with effusion and reactive arthropathy.  10 mm segment of mildly bright T2 signal within the cervical spinal cord at C3-4 associated with canal stenosis. No syrinx. No suspicious cord, leptomeningeal or epidural enhancement. 22 x 21 mm enhancing subcutaneous lesion within the paraspinal soft tissues at the cranial cervical junction most consistent with neurofibroma plaque.  Level by level evaluation (moderate to severely motion degraded axial T1 and T2 sequences limit evaluation.):  C2-3: Annular bulging, uncovertebral hypertrophy and severe facet arthropathy result in mild canal stenosis, severe RIGHT and moderate to severe LEFT neural foraminal narrowing.  C3-4: 2 mm broad-based disc bulge, uncovertebral hypertrophy and severe facet arthropathy result in moderate to severe canal stenosis, AP dimension of the canal is 8 mm wake. Severe RIGHT, moderate to severe LEFT neural foraminal narrowing.  C4-5: 1-2 mm annular bulging, uncovertebral hypertrophy. Severe facet arthropathy results in moderate canal stenosis. Severe RIGHT greater LEFT neural foraminal narrowing.  C5-6: Annular bulging, uncovertebral hypertrophy and moderate to severe RIGHT greater LEFT facet arthropathy result in mild canal stenosis, severe bilateral neural foraminal narrowing.  C6-7: No disc bulge. Uncovertebral hypertrophy and mild facet arthropathy results in moderate to  severe neural foraminal narrowing without canal stenosis.  C7-T1: No disc bulge. Uncovertebral hypertrophy and mild facet arthropathy without canal stenosis. Mild neural foraminal narrowing.  MRI THORACIC SPINE FINDINGS  Thoracic vertebral bodies and posterior elements are intact and aligned with maintenance of thoracic kyphosis. Mild congenital canal narrowing on the basis of foreshortened pedicles. Mild upper to mid thoracic epidural lipomatosis. Intervertebral discs demonstrate normal morphology, slight desiccation. No STIR signal to suggest acute osseous process. No abnormal osseous or intradiscal enhancement.  Thoracic spinal cord appears overall normal morphology and signal characteristics though motion degrades sensitivity for subtle potential parenchymal cord signal abnormality. No abnormal cord, leptomeningeal or epidural enhancement. Better imaged on cervical spine is a LEFT paraspinal subcutaneous fibroma.  Multilevel mild facet arthropathy and ligamentum flavum redundancy without canal stenosis. No definite neural foraminal narrowing the motion degrades sensitivity.  IMPRESSION: MRI HEAD: No acute intracranial process.  Nonspecific [place] matter changes, some of which may reflect sequelae of NF-1 without suspicious enhancement to suggest malignant degeneration.  MRI CERVICAL SPINE:  Motion degraded examination.  Degenerative change of the cervical spine superimposed on borderline congenital canal narrowing results in moderate to severe canal stenosis at C3-4, cord compression with 10 mm segment of nonenhancing cord edema/ pre syrinx.  Moderate canal stenosis at C4-5, mild at C2-3 and C5-6. Neural foraminal narrowings at all cervical levels: Severe from C2-3 through C5-6.  LEFT paraspinal subcutaneous neurofibroma.  MRI THORACIC SPINE:  Motion degraded examination.  Early degenerative change of thoracic spine superimposed on congenital canal narrowing without acute fracture, malalignment or nerve  compressive changes. No suspicious thoracic spine enhancement.  Acute findings discussed with and reconfirmed by Robbie Louis on 06/28/2014 at 10:50 pm.   Electronically Signed   By: Elon Alas   On: 06/28/2014 22:54     EKG Interpretation   Date/Time:  Saturday June 28 2014 15:19:06 EST Ventricular Rate:  89 PR Interval:  166 QRS Duration: 68 QT Interval:  322 QTC Calculation: 391 R Axis:   -29 Text Interpretation:  Normal sinus rhythm Possible Left atrial enlargement  Inferior infarct , age undetermined Abnormal ECG inverted t wave now noted  in V2 when compared to prior Confirmed by Christy Gentles  MD, [place] ([id number]) on  06/28/2014 3:54:04 PM  MDM   Final diagnoses:  None    Nursing notes including past medical history and social history reviewed and considered in documentation Labs/vital reviewed myself and considered during evaluation     Ripley Fraise, MD 06/28/14 [phone]

## 2014-06-28 NOTE — ED Notes (Signed)
Pt still at MRI

## 2014-06-28 NOTE — Consult Note (Signed)
Referring Physician: ED    Chief Complaint: weakness-numbness upper and lower extremities  HPI:                                                                                                                                         Eric Day is an 54 y.o. 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 He said that for the past 1.5 weeks he has been experiencing progressive, worsening legs and hands weakness. Stated that is it difficult to walk mainly when he is going upstairs. He is having trouble using his hands at work " because I can not feel my hands when cleaning dishes, I feel clumsy". Eric Day said that he is walking funny, like he is going to fall. No bladder or bowel impairment, HA, vertigo, double vision, or language impairment. Brother reports pt appears to have slurred speech that was noticed today.  Date last known well: unknown Time last known well: unknown tPA Given: no, late presentation     Past Medical History  Diagnosis Date  . Colon cancer     Past Surgical History  Procedure Laterality Date  . Abdominal surgery    . Eye surgery      History reviewed. No pertinent family history. Social History:  reports that he has never smoked. He does not have any smokeless tobacco history on file. He reports that he does not drink alcohol or use illicit drugs.  Allergies: No Known Allergies  Medications:                                                                                                                           I have reviewed the patient's current medications.  ROS:  History obtained from the patient and chart  review  General ROS: negative for - chills, fatigue, fever, night sweats, weight gain or weight loss Psychological ROS: negative for - behavioral disorder, hallucinations, memory difficulties, mood swings or suicidal ideation Ophthalmic ROS: negative for - blurry vision, double vision, or eye pain ENT ROS: negative for - epistaxis, nasal discharge, oral lesions, sore throat, tinnitus or vertigo Allergy and Immunology ROS: negative for - hives or itchy/watery eyes Hematological and Lymphatic ROS: negative for - bleeding problems, bruising or swollen lymph nodes Endocrine ROS: negative for - galactorrhea, hair pattern changes, polydipsia/polyuria or temperature intolerance Respiratory ROS: negative for - cough, hemoptysis, shortness of breath or wheezing Cardiovascular ROS: negative for - chest pain, dyspnea on exertion, edema or irregular heartbeat Gastrointestinal ROS: negative for - abdominal pain, diarrhea, hematemesis, nausea/vomiting or stool incontinence Genito-Urinary ROS: negative for - dysuria, hematuria, incontinence or urinary frequency/urgency Musculoskeletal ROS: negative for - joint swelling Neurological ROS: as noted in HPI Dermatological ROS: negative for rash and skin lesion changes  Physical exam: pleasant male in no apparent distress. Blood pressure 128/88, pulse 82, temperature 98.3 F (36.8 C), temperature source Oral, resp. rate 24, height 5' 6"  (1.676 m), weight 86.297 kg (190 lb 4 oz), SpO2 100 %. Head: normocephalic. Neck: supple, no bruits, no JVD. Cardiac: no murmurs. Lungs: clear. Abdomen: soft, no tender, no mass. Extremities: no edema. Skin: multiple, diffuse body neurofibromas Neurologic Examination:                                                                                                      General: Mental Status: Alert, oriented, thought content appropriate.  Speech fluent without evidence of aphasia.  Able to follow 3 step commands without  difficulty. Cranial Nerves: II: Discs flat bilaterally; Visual fields grossly normal, pupils equal, round, reactive to light and accommodation III,IV, VI: ptosis not present, extra-ocular motions intact bilaterally V,VII: smile symmetric, facial light touch sensation normal bilaterally VIII: hearing normal bilaterally IX,X: uvula rises symmetrically XI: bilateral shoulder shrug XII: midline tongue extension without atrophy or fasciculations  Motor: Significant for mild weakness bilateral biceps, wrist extensors, left triceps, and hip flexors left>right. Tone and bulk:normal tone throughout; no atrophy noted Sensory: Pinprick and light touch intact throughout, bilaterally Deep Tendon Reflexes:  Overactive all over, with more prominent hyperreflexia lower extremities. Plantars: Right: downgoing   Left: downgoing Cerebellar: normal finger-to-nose,  normal heel-to-shin test Gait:  No tested for safety reasons.    Results for orders placed or performed during the hospital encounter of 06/28/14 (from the past 48 hour(s))  I-stat troponin, ED (not at [facility])     Status: None   Collection Time: 06/28/14  3:32 PM  Result Value Ref Range   Troponin i, poc 0.00 0.00 - 0.08 ng/mL   Comment 3            Comment: Due to the release kinetics of cTnI, a negative result within the first hours of the onset of symptoms does not rule out myocardial infarction with certainty. If myocardial infarction is still suspected, repeat  the test at appropriate intervals.   Protime-INR     Status: None   Collection Time: 06/28/14  4:23 PM  Result Value Ref Range   Prothrombin Time 13.6 11.6 - 15.2 seconds   INR 1.03 0.00 - 1.49  APTT     Status: None   Collection Time: 06/28/14  4:23 PM  Result Value Ref Range   aPTT 34 24 - 37 seconds  CBC     Status: Abnormal   Collection Time: 06/28/14  4:23 PM  Result Value Ref Range   WBC 7.8 4.0 - 10.5 K/uL   RBC 5.87 (H) 4.22 - 5.81 MIL/uL   Hemoglobin 16.4  13.0 - 17.0 g/dL   HCT 47.2 39.0 - 52.0 %   MCV 80.4 78.0 - 100.0 fL   MCH 27.9 26.0 - 34.0 pg   MCHC 34.7 30.0 - 36.0 g/dL   RDW 14.3 11.5 - 15.5 %   Platelets 272 150 - 400 K/uL  Differential     Status: None   Collection Time: 06/28/14  4:23 PM  Result Value Ref Range   Neutrophils Relative % 53 43 - 77 %   Neutro Abs 4.1 1.7 - 7.7 K/uL   Lymphocytes Relative 34 12 - 46 %   Lymphs Abs 2.7 0.7 - 4.0 K/uL   Monocytes Relative 10 3 - 12 %   Monocytes Absolute 0.8 0.1 - 1.0 K/uL   Eosinophils Relative 2 0 - 5 %   Eosinophils Absolute 0.2 0.0 - 0.7 K/uL   Basophils Relative 1 0 - 1 %   Basophils Absolute 0.0 0.0 - 0.1 K/uL  Comprehensive metabolic panel     Status: Abnormal   Collection Time: 06/28/14  4:23 PM  Result Value Ref Range   Sodium 140 135 - 145 mmol/L   Potassium 4.0 3.5 - 5.1 mmol/L   Chloride 104 96 - 112 mmol/L   CO2 26 19 - 32 mmol/L   Glucose, Bld 96 70 - 99 mg/dL   BUN 5 (L) 6 - 23 mg/dL   Creatinine, Ser 0.97 0.50 - 1.35 mg/dL   Calcium 10.0 8.4 - 10.5 mg/dL   Total Protein 6.8 6.0 - 8.3 g/dL   Albumin 4.0 3.5 - 5.2 g/dL   AST 30 0 - 37 U/L   ALT 34 0 - 53 U/L   Alkaline Phosphatase 87 39 - 117 U/L   Total Bilirubin 0.7 0.3 - 1.2 mg/dL   GFR calc non Af Amer >90 >90 mL/min   GFR calc Af Amer >90 >90 mL/min    Comment: (NOTE) The eGFR has been calculated using the CKD EPI equation. This calculation has not been validated in all clinical situations. eGFR's persistently <90 mL/min signify possible Chronic Kidney Disease.    Anion gap 10 5 - 15   Ct Head (brain) Wo Contrast  06/28/2014   CLINICAL DATA:  Weakness in both legs started Thursday of last week and bilateral hand numbness  EXAM: CT HEAD WITHOUT CONTRAST  TECHNIQUE: Contiguous axial images were obtained from the base of the skull through the vertex without intravenous contrast.  COMPARISON:  03/03/2012  FINDINGS: Ventricles, cisterns and other CSF spaces are normal. There is no mass, mass effect,  shift of midline structures or acute hemorrhage. No evidence of acute infarction. Prosthetic right globe unchanged. Remainder of the exam is unchanged.  IMPRESSION: No acute intracranial findings.   Electronically Signed   By: Marin Olp M.D.   On: 06/28/2014 17:36  Assessment: 55 y.o. 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 Neuro-exam significant for mild bilateral biceps-left triceps-grip-hip flexors weakness and hyperreflexia mainly lower extremities. 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 Recommend: MRI brain and cervico-thoracic spine with and without contrast. Patient should be able to go home if MRI negative.  Dorian Pod, MD [facility] [phone]  06/28/2014, 6:38 PM

## 2014-06-28 NOTE — Discharge Instructions (Signed)
°  SEEK IMMEDIATE MEDICAL ATTENTION IF: You develop difficulties swallowing or breathing.  You have new or worse numbness, weakness, tingling, or movement problems in your arms or legs.  You develop increasing pain which is uncontrolled with medications.  You have change in bowel or bladder function, or other concerns.  

## 2014-06-28 NOTE — ED Notes (Signed)
Pt back from CT

## 2014-06-28 NOTE — ED Notes (Signed)
Pt here for right sided weakness that started 1 week ago. Grip strength weaker on the right. No facial droop noted. sts also numbness to right side and gait issues.

## 2014-07-03 ENCOUNTER — Other Ambulatory Visit: Payer: Self-pay | Admitting: Neurosurgery

## 2014-07-04 ENCOUNTER — Encounter (HOSPITAL_COMMUNITY): Payer: Self-pay

## 2014-07-04 ENCOUNTER — Encounter (HOSPITAL_COMMUNITY)
Admission: RE | Admit: 2014-07-04 | Discharge: 2014-07-04 | Disposition: A | Discharge status: Home / Self Care | Payer: Medicare Other | Source: Ambulatory Visit | Attending: Neurosurgery | Admitting: Neurosurgery

## 2014-07-04 DIAGNOSIS — Z8611 Personal history of tuberculosis: Secondary | ICD-10-CM | POA: Diagnosis not present

## 2014-07-04 DIAGNOSIS — Z85038 Personal history of other malignant neoplasm of large intestine: Secondary | ICD-10-CM | POA: Diagnosis not present

## 2014-07-04 DIAGNOSIS — Z79899 Other long term (current) drug therapy: Secondary | ICD-10-CM | POA: Diagnosis not present

## 2014-07-04 DIAGNOSIS — M4802 Spinal stenosis, cervical region: Secondary | ICD-10-CM | POA: Diagnosis not present

## 2014-07-04 DIAGNOSIS — G959 Disease of spinal cord, unspecified: Secondary | ICD-10-CM | POA: Diagnosis present

## 2014-07-04 DIAGNOSIS — M4712 Other spondylosis with myelopathy, cervical region: Secondary | ICD-10-CM | POA: Diagnosis not present

## 2014-07-04 DIAGNOSIS — M1990 Unspecified osteoarthritis, unspecified site: Secondary | ICD-10-CM | POA: Diagnosis not present

## 2014-07-04 HISTORY — DX: Unspecified disorder of eye and adnexa: H57.9

## 2014-07-04 HISTORY — DX: Unspecified osteoarthritis, unspecified site: M19.90

## 2014-07-04 HISTORY — DX: Respiratory tuberculosis unspecified: A15.9

## 2014-07-04 LAB — CBC
HEMATOCRIT: 47.7 % (ref 39.0–52.0)
Hemoglobin: 16.3 g/dL (ref 13.0–17.0)
MCH: 27.8 pg (ref 26.0–34.0)
MCHC: 34.2 g/dL (ref 30.0–36.0)
MCV: 81.3 fL (ref 78.0–100.0)
Platelets: 263 10*3/uL (ref 150–400)
RBC: 5.87 MIL/uL — AB (ref 4.22–5.81)
RDW: 14.5 % (ref 11.5–15.5)
WBC: 11.3 10*3/uL — ABNORMAL HIGH (ref 4.0–10.5)

## 2014-07-04 LAB — COMPREHENSIVE METABOLIC PANEL
ALBUMIN: 4 g/dL (ref 3.5–5.2)
ALT: 29 U/L (ref 0–53)
ANION GAP: 8 (ref 5–15)
AST: 21 U/L (ref 0–37)
Alkaline Phosphatase: 82 U/L (ref 39–117)
BUN: 10 mg/dL (ref 6–23)
CO2: 27 mmol/L (ref 19–32)
CREATININE: 0.99 mg/dL (ref 0.50–1.35)
Calcium: 9.7 mg/dL (ref 8.4–10.5)
Chloride: 104 mmol/L (ref 96–112)
GFR calc Af Amer: >90 mL/min (ref >90)
GFR calc non Af Amer: >90 mL/min (ref >90)
Glucose, Bld: 94 mg/dL (ref 70–99)
Potassium: 4 mmol/L (ref 3.5–5.1)
Sodium: 139 mmol/L (ref 135–145)
Total Bilirubin: 1.5 mg/dL — ABNORMAL HIGH (ref 0.3–1.2)
Total Protein: 6.7 g/dL (ref 6.0–8.3)

## 2014-07-04 LAB — SURGICAL PCR SCREEN
MRSA, PCR: NEGATIVE
Staphylococcus aureus: NEGATIVE

## 2014-07-04 NOTE — Progress Notes (Signed)
Anesthesia Chart Review:  Pt is 54 year old male scheduled for C3-4 anterior cervical decompression/discectomy on 07/07/2014 with Dr. Saintclair Halsted.   PMH includes: colon cancer, tuberculosis, neurofibromatosis 1, legally blind. History of smoking by notes. BMI 30.6  Preoperative labs reviewed.    EKG 06/28/2014: NSR. Possible Left atrial enlargement. Inferior infarct, age undetermined  Cardiac cath 08/31/2004 for chest pain and positive troponins: 1. Normal coronary angiography. 2. Normal single-plane ventriculogram, ejection fraction of 65%. 3. ECG changes and elevation in troponins may be secondary to hypertension. No further cardiac workup is recommended.  Echo 08/17/2004: - Overall left ventricular systolic function was vigorous. Left ventricular ejection fraction was estimated , range being 65% to 75%. There were no left ventricular regional wall motion abnormalities.  Discussed case/reviewed EKG with Dr. Marcie Bal.   If no changes, I anticipate pt can proceed with surgery as scheduled.   Willeen Cass, FNP-BC [facility] Short Stay Surgical Center/Anesthesiology Phone: [phone] 07/04/2014 2:28 PM

## 2014-07-04 NOTE — Pre-Procedure Instructions (Addendum)
BRONX BROGDEN  07/04/2014   Your procedure is scheduled on:  3.21.16  Report to [facility] cone short stay admitting at 1130 AM.  Call this number if you have problems the morning of surgery: [phone]   Remember:   Do not eat food or drink liquids after midnight.   Take these medicines the morning of surgery with A SIP OF WATER: pain if needed.prednisone if not finished,flomax    STOP all herbel meds, nsaids (aleve,naproxen,advil,ibuprofen) today including vitamins,aspirin     Do not wear jewelry, make-up or nail polish.  Do not wear lotions, powders, or perfumes. You may wear deodorant.  Do not shave 48 hours prior to surgery. Men may shave face and neck.  Do not bring valuables to the hospital.  [facility] is not responsible                  for any belongings or valuables.               Contacts, dentures or bridgework may not be worn into surgery.  Leave suitcase in the car. After surgery it may be brought to your room.  For patients admitted to the hospital, discharge time is determined by your                treatment team.               Patients discharged the day of surgery will not be allowed to drive  home.  Name and phone number of your driver:   Special Instructions:  Special Instructions: [place] - Preparing for Surgery  Before surgery, you can play an important role.  Because skin is not sterile, your skin needs to be as free of germs as possible.  You can reduce the number of germs on you skin by washing with CHG (chlorahexidine gluconate) soap before surgery.  CHG is an antiseptic cleaner which kills germs and bonds with the skin to continue killing germs even after washing.  Please DO NOT use if you have an allergy to CHG or antibacterial soaps.  If your skin becomes reddened/irritated stop using the CHG and inform your nurse when you arrive at Short Stay.  Do not shave (including legs and underarms) for at least 48 hours prior to the first CHG shower.  You may  shave your face.  Please follow these instructions carefully:   1.  Shower with CHG Soap the night before surgery and the morning of Surgery.  2.  If you choose to wash your hair, wash your hair first as usual with your normal shampoo.  3.  After you shampoo, rinse your hair and body thoroughly to remove the Shampoo.  4.  Use CHG as you would any other liquid soap.  You can apply chg directly  to the skin and wash gently with scrungie or a clean washcloth.  5.  Apply the CHG Soap to your body ONLY FROM THE NECK DOWN.  Do not use on open wounds or open sores.  Avoid contact with your eyes ears, mouth and genitals (private parts).  Wash genitals (private parts)       with your normal soap.  6.  Wash thoroughly, paying special attention to the area where your surgery will be performed.  7.  Thoroughly rinse your body with warm water from the neck down.  8.  DO NOT shower/wash with your normal soap after using and rinsing off the CHG Soap.  9.  Fraser Din  yourself dry with a clean towel.            10.  Wear clean pajamas.            11.  Place clean sheets on your bed the night of your first shower and do not sleep with pets.  Day of Surgery  Do not apply any lotions/deodorants the morning of surgery.  Please wear clean clothes to the hospital/surgery center.   Please read over the following fact sheets that you were given: Pain Booklet, Coughing and Deep Breathing, MRSA Information and Surgical Site Infection Prevention

## 2014-07-07 ENCOUNTER — Encounter (HOSPITAL_COMMUNITY)
Admission: RE | Disposition: A | Discharge status: Home / Self Care | Payer: Self-pay | Source: Ambulatory Visit | Attending: Neurosurgery

## 2014-07-07 ENCOUNTER — Inpatient Hospital Stay (HOSPITAL_COMMUNITY): Payer: Medicare Other | Admitting: Emergency Medicine

## 2014-07-07 ENCOUNTER — Inpatient Hospital Stay (HOSPITAL_COMMUNITY): Payer: Medicare Other | Admitting: Certified Registered Nurse Anesthetist

## 2014-07-07 ENCOUNTER — Inpatient Hospital Stay (HOSPITAL_COMMUNITY): Payer: Medicare Other

## 2014-07-07 ENCOUNTER — Encounter (HOSPITAL_COMMUNITY): Payer: Self-pay | Admitting: Certified Registered Nurse Anesthetist

## 2014-07-07 ENCOUNTER — Observation Stay (HOSPITAL_COMMUNITY)
Admission: RE | Admit: 2014-07-07 | Discharge: 2014-07-08 | Disposition: A | Discharge status: Home / Self Care | Payer: Medicare Other | Source: Ambulatory Visit | Attending: Neurosurgery | Admitting: Neurosurgery

## 2014-07-07 DIAGNOSIS — Z8611 Personal history of tuberculosis: Secondary | ICD-10-CM | POA: Insufficient documentation

## 2014-07-07 DIAGNOSIS — M5412 Radiculopathy, cervical region: Secondary | ICD-10-CM | POA: Diagnosis present

## 2014-07-07 DIAGNOSIS — M1990 Unspecified osteoarthritis, unspecified site: Secondary | ICD-10-CM | POA: Diagnosis not present

## 2014-07-07 DIAGNOSIS — Z85038 Personal history of other malignant neoplasm of large intestine: Secondary | ICD-10-CM | POA: Insufficient documentation

## 2014-07-07 DIAGNOSIS — Z419 Encounter for procedure for purposes other than remedying health state, unspecified: Secondary | ICD-10-CM

## 2014-07-07 DIAGNOSIS — M4802 Spinal stenosis, cervical region: Secondary | ICD-10-CM | POA: Diagnosis not present

## 2014-07-07 DIAGNOSIS — Z79899 Other long term (current) drug therapy: Secondary | ICD-10-CM | POA: Insufficient documentation

## 2014-07-07 DIAGNOSIS — G959 Disease of spinal cord, unspecified: Secondary | ICD-10-CM | POA: Diagnosis present

## 2014-07-07 DIAGNOSIS — M4712 Other spondylosis with myelopathy, cervical region: Secondary | ICD-10-CM | POA: Diagnosis not present

## 2014-07-07 HISTORY — PX: ANTERIOR CERVICAL DECOMP/DISCECTOMY FUSION: SHX1161

## 2014-07-07 SURGERY — ANTERIOR CERVICAL DECOMPRESSION/DISCECTOMY FUSION 1 LEVEL
Anesthesia: General | Site: Spine Cervical

## 2014-07-07 MED ORDER — ROCURONIUM BROMIDE 50 MG/5ML IV SOLN
INTRAVENOUS | Status: AC
  Filled 2014-07-07: qty 1

## 2014-07-07 MED ORDER — LIDOCAINE HCL (CARDIAC) 20 MG/ML IV SOLN
INTRAVENOUS | Status: AC
  Filled 2014-07-07: qty 5

## 2014-07-07 MED ORDER — CEFAZOLIN SODIUM-DEXTROSE 2-3 GM-% IV SOLR
INTRAVENOUS | Status: DC | PRN
  Administered 2014-07-07: 2 g via INTRAVENOUS

## 2014-07-07 MED ORDER — ACETAMINOPHEN 650 MG RE SUPP: 650.0000 mg | RECTAL | Status: DC | PRN

## 2014-07-07 MED ORDER — ONDANSETRON HCL 4 MG/2ML IJ SOLN: 4.0000 mg | INTRAMUSCULAR | Status: DC | PRN

## 2014-07-07 MED ORDER — ONDANSETRON HCL 4 MG/2ML IJ SOLN: 4.0000 mg | Freq: Once | INTRAMUSCULAR | Status: DC | PRN

## 2014-07-07 MED ORDER — CYCLOBENZAPRINE HCL 10 MG PO TABS
10.0000 mg | ORAL_TABLET | Freq: Three times a day (TID) | ORAL | Status: DC | PRN
  Administered 2014-07-07 – 2014-07-08 (×2): 10 mg via ORAL
  Filled 2014-07-07 (×2): qty 1

## 2014-07-07 MED ORDER — DEXAMETHASONE SODIUM PHOSPHATE 4 MG/ML IJ SOLN
4.0000 mg | Freq: Four times a day (QID) | INTRAMUSCULAR | Status: DC
  Filled 2014-07-07 (×4): qty 1

## 2014-07-07 MED ORDER — SODIUM CHLORIDE 0.9 % IJ SOLN
3.0000 mL | Freq: Two times a day (BID) | INTRAMUSCULAR | Status: DC
  Administered 2014-07-07: 3 mL via INTRAVENOUS

## 2014-07-07 MED ORDER — HYDROMORPHONE HCL 1 MG/ML IJ SOLN
0.2500 mg | INTRAMUSCULAR | Status: DC | PRN
  Administered 2014-07-07: 0.5 mg via INTRAVENOUS

## 2014-07-07 MED ORDER — NEOSTIGMINE METHYLSULFATE 10 MG/10ML IV SOLN
INTRAVENOUS | Status: DC | PRN
  Administered 2014-07-07: 4 mg via INTRAVENOUS

## 2014-07-07 MED ORDER — 0.9 % SODIUM CHLORIDE (POUR BTL) OPTIME
TOPICAL | Status: DC | PRN
  Administered 2014-07-07: 1000 mL

## 2014-07-07 MED ORDER — THROMBIN 5000 UNITS EX SOLR
CUTANEOUS | Status: DC | PRN
  Administered 2014-07-07 (×2): 5000 [IU] via TOPICAL

## 2014-07-07 MED ORDER — DOCUSATE SODIUM 100 MG PO CAPS
100.0000 mg | ORAL_CAPSULE | Freq: Two times a day (BID) | ORAL | Status: DC
  Administered 2014-07-07 – 2014-07-08 (×2): 100 mg via ORAL
  Filled 2014-07-07 (×2): qty 1

## 2014-07-07 MED ORDER — GLYCOPYRROLATE 0.2 MG/ML IJ SOLN
INTRAMUSCULAR | Status: DC | PRN
  Administered 2014-07-07: 0.6 mg via INTRAVENOUS

## 2014-07-07 MED ORDER — MENTHOL 3 MG MT LOZG: 1.0000 | LOZENGE | OROMUCOSAL | Status: DC | PRN

## 2014-07-07 MED ORDER — CEFAZOLIN SODIUM-DEXTROSE 2-3 GM-% IV SOLR
INTRAVENOUS | Status: AC
  Filled 2014-07-07: qty 50

## 2014-07-07 MED ORDER — DEXAMETHASONE SODIUM PHOSPHATE 4 MG/ML IJ SOLN
INTRAMUSCULAR | Status: AC
  Filled 2014-07-07: qty 2

## 2014-07-07 MED ORDER — PROPOFOL 10 MG/ML IV BOLUS
INTRAVENOUS | Status: DC | PRN
  Administered 2014-07-07: 200 mg via INTRAVENOUS

## 2014-07-07 MED ORDER — FENTANYL CITRATE 0.05 MG/ML IJ SOLN
INTRAMUSCULAR | Status: AC
  Filled 2014-07-07: qty 5

## 2014-07-07 MED ORDER — OXYCODONE-ACETAMINOPHEN 5-325 MG PO TABS: 1.0000 | ORAL_TABLET | ORAL | Status: DC | PRN

## 2014-07-07 MED ORDER — MIDAZOLAM HCL 2 MG/2ML IJ SOLN
INTRAMUSCULAR | Status: AC
  Filled 2014-07-07: qty 2

## 2014-07-07 MED ORDER — HEMOSTATIC AGENTS (NO CHARGE) OPTIME
TOPICAL | Status: DC | PRN
  Administered 2014-07-07: 1 via TOPICAL

## 2014-07-07 MED ORDER — PROPOFOL 10 MG/ML IV BOLUS
INTRAVENOUS | Status: AC
  Filled 2014-07-07: qty 20

## 2014-07-07 MED ORDER — ALUM & MAG HYDROXIDE-SIMETH 200-200-20 MG/5ML PO SUSP: 30.0000 mL | Freq: Four times a day (QID) | ORAL | Status: DC | PRN

## 2014-07-07 MED ORDER — FENTANYL CITRATE 0.05 MG/ML IJ SOLN
INTRAMUSCULAR | Status: DC | PRN
  Administered 2014-07-07 (×3): 50 ug via INTRAVENOUS
  Administered 2014-07-07: 150 ug via INTRAVENOUS

## 2014-07-07 MED ORDER — LIDOCAINE HCL 4 % MT SOLN
OROMUCOSAL | Status: DC | PRN
  Administered 2014-07-07: 4 mL via TOPICAL

## 2014-07-07 MED ORDER — HYDROMORPHONE HCL 1 MG/ML IJ SOLN: 0.5000 mg | INTRAMUSCULAR | Status: DC | PRN

## 2014-07-07 MED ORDER — PHENOL 1.4 % MT LIQD: 1.0000 | OROMUCOSAL | Status: DC | PRN

## 2014-07-07 MED ORDER — PHENYLEPHRINE HCL 10 MG/ML IJ SOLN
10.0000 mg | INTRAVENOUS | Status: DC | PRN
  Administered 2014-07-07: 20 ug/min via INTRAVENOUS

## 2014-07-07 MED ORDER — OXYCODONE-ACETAMINOPHEN 5-325 MG PO TABS
1.0000 | ORAL_TABLET | ORAL | Status: DC | PRN
  Administered 2014-07-07 – 2014-07-08 (×2): 2 via ORAL
  Administered 2014-07-08: 1 via ORAL
  Filled 2014-07-07 (×2): qty 2
  Filled 2014-07-07: qty 1

## 2014-07-07 MED ORDER — TAMSULOSIN HCL 0.4 MG PO CAPS
0.4000 mg | ORAL_CAPSULE | Freq: Every day | ORAL | Status: DC
  Administered 2014-07-07 – 2014-07-08 (×2): 0.4 mg via ORAL
  Filled 2014-07-07 (×2): qty 1

## 2014-07-07 MED ORDER — ACETAMINOPHEN 325 MG PO TABS: 650.0000 mg | ORAL_TABLET | ORAL | Status: DC | PRN

## 2014-07-07 MED ORDER — SODIUM CHLORIDE 0.9 % IR SOLN
Status: DC | PRN
  Administered 2014-07-07: 500 mL

## 2014-07-07 MED ORDER — DEXAMETHASONE SODIUM PHOSPHATE 4 MG/ML IJ SOLN
INTRAMUSCULAR | Status: DC | PRN
  Administered 2014-07-07: 10 mg via INTRAVENOUS

## 2014-07-07 MED ORDER — HYDROMORPHONE HCL 1 MG/ML IJ SOLN
INTRAMUSCULAR | Status: AC
  Filled 2014-07-07: qty 1

## 2014-07-07 MED ORDER — SUCCINYLCHOLINE CHLORIDE 20 MG/ML IJ SOLN
INTRAMUSCULAR | Status: DC | PRN
  Administered 2014-07-07: 140 mg via INTRAVENOUS

## 2014-07-07 MED ORDER — ROCURONIUM BROMIDE 100 MG/10ML IV SOLN
INTRAVENOUS | Status: DC | PRN
  Administered 2014-07-07: 20 mg via INTRAVENOUS
  Administered 2014-07-07: 30 mg via INTRAVENOUS

## 2014-07-07 MED ORDER — THROMBIN 5000 UNITS EX SOLR
OROMUCOSAL | Status: DC | PRN
  Administered 2014-07-07: 5 mL via TOPICAL

## 2014-07-07 MED ORDER — SODIUM CHLORIDE 0.9 % IV SOLN: 250.0000 mL | INTRAVENOUS | Status: DC

## 2014-07-07 MED ORDER — LACTATED RINGERS IV SOLN
INTRAVENOUS | Status: DC | PRN
  Administered 2014-07-07: 14:00:00 via INTRAVENOUS

## 2014-07-07 MED ORDER — NEOSTIGMINE METHYLSULFATE 10 MG/10ML IV SOLN
INTRAVENOUS | Status: AC
  Filled 2014-07-07: qty 1

## 2014-07-07 MED ORDER — LIDOCAINE HCL (CARDIAC) 20 MG/ML IV SOLN
INTRAVENOUS | Status: AC
  Filled 2014-07-07: qty 10

## 2014-07-07 MED ORDER — GLYCOPYRROLATE 0.2 MG/ML IJ SOLN
INTRAMUSCULAR | Status: AC
  Filled 2014-07-07: qty 3

## 2014-07-07 MED ORDER — LACTATED RINGERS IV SOLN
INTRAVENOUS | Status: DC
  Administered 2014-07-07: 12:00:00 via INTRAVENOUS

## 2014-07-07 MED ORDER — CEFAZOLIN SODIUM-DEXTROSE 2-3 GM-% IV SOLR
2.0000 g | Freq: Three times a day (TID) | INTRAVENOUS | Status: AC
  Administered 2014-07-07 – 2014-07-08 (×2): 2 g via INTRAVENOUS
  Filled 2014-07-07 (×2): qty 50

## 2014-07-07 MED ORDER — DEXAMETHASONE 4 MG PO TABS
4.0000 mg | ORAL_TABLET | Freq: Four times a day (QID) | ORAL | Status: DC
  Administered 2014-07-07 – 2014-07-08 (×4): 4 mg via ORAL
  Filled 2014-07-07 (×7): qty 1

## 2014-07-07 MED ORDER — LIDOCAINE HCL (CARDIAC) 20 MG/ML IV SOLN
INTRAVENOUS | Status: DC | PRN
  Administered 2014-07-07: 100 mg via INTRAVENOUS

## 2014-07-07 MED ORDER — SODIUM CHLORIDE 0.9 % IJ SOLN: 3.0000 mL | INTRAMUSCULAR | Status: DC | PRN

## 2014-07-07 SURGICAL SUPPLY — 61 items
BAG DECANTER FOR FLEXI CONT (MISCELLANEOUS) ×2 IMPLANT
BENZOIN TINCTURE PRP APPL 2/3 (GAUZE/BANDAGES/DRESSINGS) ×2 IMPLANT
BIT DRILL SPINE QC 14 (BIT) ×2 IMPLANT
BRUSH SCRUB EZ PLAIN DRY (MISCELLANEOUS) ×2 IMPLANT
BUR MATCHSTICK NEURO 3.0 LAGG (BURR) ×2 IMPLANT
CANISTER SUCT 3000ML PPV (MISCELLANEOUS) ×2 IMPLANT
CONT SPEC 4OZ CLIKSEAL STRL BL (MISCELLANEOUS) ×2 IMPLANT
DRAPE C-ARM 42X72 X-RAY (DRAPES) ×4 IMPLANT
DRAPE LAPAROTOMY 100X72 PEDS (DRAPES) ×2 IMPLANT
DRAPE MICROSCOPE LEICA (MISCELLANEOUS) ×2 IMPLANT
DRAPE POUCH INSTRU U-SHP 10X18 (DRAPES) ×2 IMPLANT
DRSG OPSITE POSTOP 4X6 (GAUZE/BANDAGES/DRESSINGS) ×2 IMPLANT
DURAPREP 6ML APPLICATOR 50/CS (WOUND CARE) ×2 IMPLANT
ELECT COATED BLADE 2.86 ST (ELECTRODE) ×2 IMPLANT
ELECT REM PT RETURN 9FT ADLT (ELECTROSURGICAL) ×2
ELECTRODE REM PT RTRN 9FT ADLT (ELECTROSURGICAL) ×1 IMPLANT
GAUZE SPONGE 4X4 12PLY STRL (GAUZE/BANDAGES/DRESSINGS) ×2 IMPLANT
GAUZE SPONGE 4X4 16PLY XRAY LF (GAUZE/BANDAGES/DRESSINGS) IMPLANT
GLOVE BIO SURGEON STRL SZ 6.5 (GLOVE) ×2 IMPLANT
GLOVE BIO SURGEON STRL SZ8 (GLOVE) ×2 IMPLANT
GLOVE BIOGEL PI IND STRL 6.5 (GLOVE) ×1 IMPLANT
GLOVE BIOGEL PI IND STRL 7.0 (GLOVE) ×2 IMPLANT
GLOVE BIOGEL PI INDICATOR 6.5 (GLOVE) ×1
GLOVE BIOGEL PI INDICATOR 7.0 (GLOVE) ×2
GLOVE ECLIPSE 6.5 STRL STRAW (GLOVE) ×4 IMPLANT
GLOVE ECLIPSE 9.0 STRL (GLOVE) ×2 IMPLANT
GLOVE EXAM NITRILE LRG STRL (GLOVE) IMPLANT
GLOVE EXAM NITRILE MD LF STRL (GLOVE) IMPLANT
GLOVE EXAM NITRILE XL STR (GLOVE) IMPLANT
GLOVE EXAM NITRILE XS STR PU (GLOVE) IMPLANT
GLOVE INDICATOR 8.5 STRL (GLOVE) ×2 IMPLANT
GOWN STRL REUS W/ TWL LRG LVL3 (GOWN DISPOSABLE) ×2 IMPLANT
GOWN STRL REUS W/ TWL XL LVL3 (GOWN DISPOSABLE) ×2 IMPLANT
GOWN STRL REUS W/TWL 2XL LVL3 (GOWN DISPOSABLE) IMPLANT
GOWN STRL REUS W/TWL LRG LVL3 (GOWN DISPOSABLE) ×2
GOWN STRL REUS W/TWL XL LVL3 (GOWN DISPOSABLE) ×2
HALTER HD/CHIN CERV TRACTION D (MISCELLANEOUS) ×2 IMPLANT
HEMOSTAT POWDER KIT SURGIFOAM (HEMOSTASIS) ×2 IMPLANT
KIT BASIN OR (CUSTOM PROCEDURE TRAY) ×2 IMPLANT
KIT ROOM TURNOVER OR (KITS) ×2 IMPLANT
LIQUID BAND (GAUZE/BANDAGES/DRESSINGS) ×2 IMPLANT
NEEDLE HYPO 18GX1.5 BLUNT FILL (NEEDLE) IMPLANT
NEEDLE SPNL 20GX3.5 QUINCKE YW (NEEDLE) ×2 IMPLANT
NS IRRIG 1000ML POUR BTL (IV SOLUTION) ×2 IMPLANT
PACK LAMINECTOMY NEURO (CUSTOM PROCEDURE TRAY) ×2 IMPLANT
PAD ARMBOARD 7.5X6 YLW CONV (MISCELLANEOUS) ×6 IMPLANT
PLATE ANT CERV XTEND 1 LV 16 (Plate) ×2 IMPLANT
RUBBERBAND STERILE (MISCELLANEOUS) ×4 IMPLANT
SCREW XTD VAR 4.2 SELF TAP (Screw) ×8 IMPLANT
SPACER CERV FRGE 12X14X9-7 (Spacer) ×2 IMPLANT
SPONGE INTESTINAL PEANUT (DISPOSABLE) ×2 IMPLANT
SPONGE SURGIFOAM ABS GEL SZ50 (HEMOSTASIS) ×2 IMPLANT
STRIP CLOSURE SKIN 1/2X4 (GAUZE/BANDAGES/DRESSINGS) ×2 IMPLANT
SUT VIC AB 3-0 SH 8-18 (SUTURE) ×2 IMPLANT
SUT VICRYL 4-0 PS2 18IN ABS (SUTURE) ×2 IMPLANT
SYR 20ML ECCENTRIC (SYRINGE) ×2 IMPLANT
TAPE CLOTH 4X10 WHT NS (GAUZE/BANDAGES/DRESSINGS) IMPLANT
TOWEL OR 17X24 6PK STRL BLUE (TOWEL DISPOSABLE) ×2 IMPLANT
TOWEL OR 17X26 10 PK STRL BLUE (TOWEL DISPOSABLE) ×2 IMPLANT
TRAP SPECIMEN MUCOUS 40CC (MISCELLANEOUS) ×2 IMPLANT
WATER STERILE IRR 1000ML POUR (IV SOLUTION) ×2 IMPLANT

## 2014-07-07 NOTE — Anesthesia Postprocedure Evaluation (Signed)
  Anesthesia Post-op Note  Patient: Eric Day  Procedure(s) Performed: Procedure(s): ANTERIOR CERVICAL DECOMPRESSION/DISCECTOMY FUSION CERVICAL THREE-FOUR (N/A)  Patient Location: PACU  Anesthesia Type:General  Level of Consciousness: awake, oriented, sedated and patient cooperative  Airway and Oxygen Therapy: Patient Spontanous Breathing  Post-op Pain: mild  Post-op Assessment: Post-op Vital signs reviewed, Patient's Cardiovascular Status Stable, Respiratory Function Stable, Patent Airway, No signs of Nausea or vomiting and Pain level controlled  Post-op Vital Signs: stable  Last Vitals:  Filed Vitals:   07/07/14 1730  BP:   Pulse: 97  Temp: 37.1 C  Resp: 16    Complications: No apparent anesthesia complications

## 2014-07-07 NOTE — Op Note (Signed)
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  EBL: Minimal  History of present illness: Patient is a very pleasant 54 year old some is a progress worsening neck pain with bilateral arm and hand numbness and weakness and progressive difficulty walking. Clinical exam was consistent with a progressive myelopathy MRI scan showed severe cord compression signal change within the cord at C3-4. Due the patient's progression of clinical syndrome imaging findings and face conservative treatment I recommended emergent anterior cervical discectomy and fusion I extensively went over the risks and benefits of the operation with the patient as well as perioperative course expectations of outcome and alternatives of surgery and he understands and agrees to proceed forward.  Operative procedure: Patient brought into the or was induced under general anesthesia positioned supine the neck in slight extension in 5 pounds of halter traction the right 76 prepped and draped in routine sterile fashion preoperative x-ray localize the appropriate level so a curvilinear incision was made just off midline to the interbody the sternomastoid and the superficial layer of the platysma was dissected and divided longitudinally the avascular plane to sternomastoid and strap muscles was developed. Fascia precautions doesn't with Kitners. Interoperative x-ray confirmed the appropriate level so annulotomy was made with a 15 scalpel marked the disc space and the longus Landry Mellow he was reflected laterally and self-retaining retractor was placed. Disc spaces and further incised scraped with a BA  curettes cleaned out with a went to the Kerrison punch. Under microscopic illumination of very large posterior spur coming off the C3 vertebral body was immediately identified and aggressively under bitten behind the body of C3. Aggressively under biting both endplates at both levels significant decompress the central canal marking laterally I carried this out laterally to the level of the C4 pedicle and skeletonized C4 nerve root. The cord was pulsatile underneath the dura immediately visualized and the microscope. Then I sized up and implant selected a 9 mm allograft wedge distracted the vertebral bodies with the vertebral body distractor inserted the 9 mm wedge opening up the disc space and further decompressing the spinal canal. Then a 16 mm globus extend plate was placed all screws excellent purchase locking mechanism was engaged. Wounds and copiously irrigated meticulous hemostasis was maintained the wounds and closed in layers with after Vicryl and platysma and a running 4 subcuticular. Benzoin and Dermabond Steri-Strips and sterile dressings applied patient recovered in stable condition. At the end of case all needle counts sponge counts were correct.

## 2014-07-07 NOTE — Transfer of Care (Signed)
Immediate Anesthesia Transfer of Care Note  Patient: Eric Day  Procedure(s) Performed: Procedure(s): ANTERIOR CERVICAL DECOMPRESSION/DISCECTOMY FUSION CERVICAL THREE-FOUR (N/A)  Patient Location: PACU  Anesthesia Type:General  Level of Consciousness: awake, alert  and oriented  Airway & Oxygen Therapy: Patient Spontanous Breathing and Patient connected to nasal cannula oxygen  Post-op Assessment: Report given to RN, Post -op Vital signs reviewed and stable and Patient moving all extremities  Post vital signs: Reviewed and stable  Last Vitals:  Filed Vitals:   07/07/14 1630  BP: 148/89  Pulse: 84  Temp:   Resp: 17    Complications: No apparent anesthesia complications

## 2014-07-07 NOTE — H&P (Signed)
Eric Day is an 54 y.o. male.   Chief Complaint: Neck pain hand numbness HPI: 54 year old gentleman with progressive worsening numbness tingling in his hands that's progressed over the last 2 weeks. As on last week in the office and he then placed on steroids emergency room and he was stable right was having difficulty feeling his fingers and was getting weakness in his hands. Since I saw him in the office he has got worse with increased numbness increased weakness in his hands and difficulty walking. Imaging showed severe cervical stenosis with signal changes cord and cord compression at C3-4. I recommended anterior cervical discectomy and fusion at that level of extensor reviewed the risks and benefits of the operation with the patient as well as perioperative course expectations of outcome and alternatives surgery and he understands and agrees to proceed forward.  Past Medical History  Diagnosis Date  . Colon cancer   . Eye disorder     rt artificial  . Tuberculosis     70's  . Arthritis     Past Surgical History  Procedure Laterality Date  . Abdominal surgery  90's    stomach?  . Eye surgery  90's  . Hernia repair      hernia as child    History reviewed. No pertinent family history. Social History:  reports that he has never smoked. He does not have any smokeless tobacco history on file. He reports that he does not drink alcohol or use illicit drugs.  Allergies: No Known Allergies  Medications Prior to Admission  Medication Sig Dispense Refill  . Multiple Vitamins-Minerals (CENTRUM SILVER ADULT 50+ PO) Take 1 capsule by mouth daily.    Marland Kitchen oxyCODONE-acetaminophen (PERCOCET/ROXICET) 5-325 MG per tablet Take 1 tablet by mouth every 4 (four) hours as needed for severe pain. 15 tablet 0  . tamsulosin (FLOMAX) 0.4 MG CAPS capsule Take 1 capsule by mouth daily.  1  . predniSONE (DELTASONE) 50 MG tablet One tablet PO daily for 4 days 4 tablet 0    No results found for this or any  previous visit (from the past 48 hour(s)). No results found.  Review of Systems  Constitutional: Negative.   Eyes: Negative.   Respiratory: Negative.   Cardiovascular: Negative.   Gastrointestinal: Negative.   Genitourinary: Negative.   Musculoskeletal: Positive for myalgias and neck pain.  Skin: Negative.   Neurological: Positive for tingling and sensory change.  Endo/Heme/Allergies: Negative.   Psychiatric/Behavioral: Negative.     Blood pressure 118/85, pulse 96, temperature 97.6 F (36.4 C), temperature source Oral, resp. rate 20, height 5\' 6"  (1.676 m), weight 85.911 kg (189 lb 6.4 oz), SpO2 100 %. Physical Exam  Constitutional: He is oriented to person, place, and time. He appears well-developed and well-nourished.  HENT:  Head: Normocephalic.  Eyes: Pupils are equal, round, and reactive to light.  Neck: Normal range of motion.  Respiratory: Effort normal.  GI: Soft.  Neurological: He is alert and oriented to person, place, and time. GCS eye subscore is 4. GCS verbal subscore is 5. GCS motor subscore is 6. He displays Babinski's sign on the left side.  Reflex Scores:      Patellar reflexes are 3+ on the right side and 3+ on the left side.      Achilles reflexes are 3+ on the right side and 3+ on the left side. Patient is awake significant limitation in range of motion of his neck strength is 5 out of 5 in his deltoids  4+ out of 5 biceps triceps and intrinsics 4 out of 5 decreased sensation in both upper extremities gait is floridly ataxic and is hyperreflexic     Assessment/Plan 54 year old gentleman with cervical myelopathy and severe stenosis with signal change at C3-4 presents for an ACDF at C3-4. I've explained and extensively the fact that his insurance commonly had not yet approved for surgery however he has had a progression of his symptoms over the last several days worsening numbness in his hands worsening weakness and difficulty walking. Therefore he understands the  risk and wants to proceed forward regardless of whether insurance commonly ultimately denies his surgery or not. I strongly feel to the patient's best interest not to delay surgery any longer and do think he has progression of her myelopathy and cervical cord compressive syndrome. And he further delay significantly increases his chance of permanent disability.  Wiliam Cauthorn P 07/07/2014, 2:08 PM

## 2014-07-07 NOTE — Anesthesia Procedure Notes (Signed)
Procedure Name: Intubation Date/Time: 07/07/2014 2:22 PM Performed by: Trixie Deis A Pre-anesthesia Checklist: Patient identified, Timeout performed, Emergency Drugs available, Suction available and Patient being monitored Patient Re-evaluated:Patient Re-evaluated prior to inductionOxygen Delivery Method: Circle system utilized Preoxygenation: Pre-oxygenation with 100% oxygen Intubation Type: IV induction Ventilation: Mask ventilation without difficulty Laryngoscope Size: Mac and 4 Grade View: Grade I Tube type: Oral Tube size: 7.5 mm Number of attempts: 1 Airway Equipment and Method: Stylet and LTA kit utilized Placement Confirmation: ETT inserted through vocal cords under direct vision,  breath sounds checked- equal and bilateral and positive ETCO2 Secured at: 23 cm Tube secured with: Tape Dental Injury: Teeth and Oropharynx as per pre-operative assessment

## 2014-07-07 NOTE — Anesthesia Preprocedure Evaluation (Addendum)
Anesthesia Evaluation  Patient identified by MRN, date of birth, ID band Patient awake    Reviewed: Allergy & Precautions, NPO status , Patient's Chart, lab work & pertinent test results  History of Anesthesia Complications Negative for: history of anesthetic complications  Airway Mallampati: I  TM Distance: >3 FB Neck ROM: Full    Dental  (+) Edentulous Upper, Upper Dentures, Poor Dentition, Dental Advisory Given   Pulmonary          Cardiovascular     Neuro/Psych Artificial R eye, blindness- wears glasses. Neurofibromatosis I    GI/Hepatic   Endo/Other    Renal/GU      Musculoskeletal  (+) Arthritis -,   Abdominal   Peds  Hematology   Anesthesia Other Findings Poor oral hygiene  Reproductive/Obstetrics                           Anesthesia Physical Anesthesia Plan  ASA: II  Anesthesia Plan: General   Post-op Pain Management:    Induction: Intravenous  Airway Management Planned: Oral ETT  Additional Equipment:   Intra-op Plan:   Post-operative Plan: Extubation in OR  Informed Consent: I have reviewed the patients History and Physical, chart, labs and discussed the procedure including the risks, benefits and alternatives for the proposed anesthesia with the patient or authorized representative who has indicated his/her understanding and acceptance.     Plan Discussed with: CRNA, Anesthesiologist and Surgeon  Anesthesia Plan Comments:         Anesthesia Quick Evaluation

## 2014-07-07 NOTE — Progress Notes (Signed)
Orthopedic Tech Progress Note Patient Details:  Eric Day 05/31/1960 [id number] Delivered foam cervical collar to pt.'s nurse. Patient ID: Eric Day, male   DOB: Jul 02, 1960, 54 y.o.   MRN: [id number]   Darrol Poke 07/07/2014, 3:26 PM

## 2014-07-08 ENCOUNTER — Encounter (HOSPITAL_COMMUNITY): Payer: Self-pay | Admitting: Neurosurgery

## 2014-07-08 DIAGNOSIS — M4802 Spinal stenosis, cervical region: Secondary | ICD-10-CM | POA: Diagnosis not present

## 2014-07-08 MED ORDER — OXYCODONE-ACETAMINOPHEN 5-325 MG PO TABS: 1.0000 | ORAL_TABLET | ORAL | Status: DC | PRN

## 2014-07-08 NOTE — Progress Notes (Signed)
PT Cancellation/Discharge Note  Patient Details Name: Eric Day MRN: [id number] DOB: 12-03-1960   Cancelled Treatment:    Reason Eval/Treat Not Completed: Other (comment) Discussed case with OT and with PT.  Both feel he is about at baseline for mobility and does not require PT consult at this time.  Pt anticipating DC today and has no questions for PT at this time.   Melvern Banker 07/08/2014, 10:59 AM  Lavonia Dana, PT  [phone] 07/08/2014

## 2014-07-08 NOTE — Evaluation (Signed)
Occupational Therapy Evaluation and Discharge Patient Details Name: Eric Day MRN: [id number] DOB: 02/17/1961 Today's Date: 07/08/2014    History of Present Illness Neck pain hand numbness s/p Anterior cervical discectomy and fusion at C3-4   Clinical Impression   This 54 yo male admitted and underwent above presents to acute OT with all education completed. No further OT needs and pt reports that although he walks stiffly--this is his normal and pt does not have to do steps (no PT needs identified). We will sign off    Follow Up Recommendations  No OT follow up    Equipment Recommendations  None recommended by OT       Precautions / Restrictions Precautions Precautions: Cervical Precaution Comments: Went over items on handout, but did not issue due to patient blind in right eye and only partial vision in left eye--cannot see writing on our handout Required Braces or Orthoses: Cervical Brace Cervical Brace: Soft collar Restrictions Weight Bearing Restrictions: No      Mobility Bed Mobility Overal bed mobility: Modified Independent             General bed mobility comments: explained to pt how he is to get in and OOB and he returned demonstrated  Transfers Overall transfer level: Modified independent Equipment used: None             General transfer comment: Ambulates with stiffness--he said this is normal, the only thing that is not quite normal is his speed per his report (however he is currently safe at his current speed). He does not have to do steps, he can take elevator at home and at work--I advised him to do so at least until he goes back to see his surgeon         ADL Overall ADL's : Modified independent                                       General ADL Comments: Went over with pt to use a spit cup and a rinse cup with straw for brushing teeth. He will and can cross his legs to get to his feet. He gets down in and out of the  tub--I advised him to go slow. He wants to go back to work--I explained he would have to discuss this with his Psychologist, sport and exercise. Advised him that he should not be trying to carry more than 5 lbs at a time (he says his brother can help him with groceries)     Vision Additional Comments: Pt with blindness in right eye and only partial sight in left eye (cannot see detail)          Pertinent Vitals/Pain Pain Assessment: No/denies pain     Hand Dominance Right   Extremity/Trunk Assessment Upper Extremity Assessment Upper Extremity Assessment: Overall WFL for tasks assessed (no longer has numbness and strength is 4-4+/5 from elbows distally; 3/5 shoulder (resistance not used due to cervical surgery))   Lower Extremity Assessment Lower Extremity Assessment: Overall WFL for tasks assessed       Communication Communication Communication: HOH   Cognition Arousal/Alertness: Awake/alert Behavior During Therapy: WFL for tasks assessed/performed Overall Cognitive Status: Within Functional Limits for tasks assessed                                Home Living Family/patient expects  to be discharged to:: Private residence Living Arrangements: Alone Available Help at Discharge: Family;Available PRN/intermittently Type of Home: Apartment Home Access: Elevator     Home Layout: One level     Bathroom Shower/Tub: Tub/shower unit;Curtain Shower/tub characteristics: Architectural technologist: Standard     Home Equipment: None          Prior Functioning/Environment Level of Independence: Independent             OT Diagnosis: Generalized weakness         OT Goals(Current goals can be found in the care plan section) Acute Rehab OT Goals Patient Stated Goal: to go back to work  OT Frequency:                End of Session Equipment Utilized During Treatment: International aid/development worker Communication: Mobility status (nurse tech)  Activity Tolerance: Patient tolerated  treatment well Patient left: in chair;with call bell/phone within reach   Time: 0810-0828 OT Time Calculation (min): 18 min Charges:  OT General Charges $OT Visit: 1 Procedure OT Evaluation $Initial OT Evaluation Tier I: 1 Procedure G-Codes: OT G-codes **NOT FOR INPATIENT CLASS** Functional Assessment Tool Used: Clinical observation Self Care Current Status (X6147): At least 1 percent but less than 20 percent impaired, limited or restricted Self Care Goal Status (W9295): At least 1 percent but less than 20 percent impaired, limited or restricted Self Care Discharge Status [phone]): At least 1 percent but less than 20 percent impaired, limited or restricted  Almon Register [phone] 07/08/2014, 8:52 AM

## 2014-07-08 NOTE — Progress Notes (Signed)
Patient ID: Eric Day, male   DOB: [birth year], 54 y.o.   MRN: [id number] Patient is doing well significant improvement numbness in his hands  Swallowing okay neurologically stable wound flat clean dry  Plan discharge home

## 2014-07-08 NOTE — Discharge Instructions (Signed)
No lifting no bending no twisting no driving a riding a car unless he is come back and forth to see me. Keep the incision clean dry and intact. May shower with the current dressing on. May remove the outer dressing in 3-4 days leave the strips the tape on and intact. Cover the Steri-Strips with saran wrap for showers only.    Wound Care Keep incision covered and dry for one week.  If you shower prior to then, cover incision with plastic wrap.  You may remove outer bandage after one week and shower.  Do not put any creams, lotions, or ointments on incision. Leave steri-strips on neck.  They will fall off by themselves. Activity Walk each and every day, increasing distance each day. No lifting greater than 5 lbs.  Avoid excessive neck motion. No driving for 2 weeks; may ride as a passenger locally. Wear neck brace at all times except when showering or otherwise instructed. Diet Resume your normal diet.  Return to Work Will be discussed at you follow up appointment. Call Your Doctor If Any of These Occur Redness, drainage, or swelling at the wound.  Temperature greater than 101 degrees. Severe pain not relieved by pain medication. Increased difficulty swallowing.  Incision starts to come apart. Follow Up Appt Call today for appointment in 1-2 weeks ([phone]) or for problems.  If you have any hardware placed in your spine, you will need an x-ray before your appointment.

## 2014-07-08 NOTE — Discharge Summary (Signed)
  Physician Discharge Summary  Patient ID: Eric Day MRN: [id number] DOB/AGE: 56-25-1962 56 y.o.  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 Postoperatively patient did very well recovered in the floor on the floor was angling and voiding spontaneously tolerating regular diet stable for discharge home.  Consults: Significant Diagnostic Studies: Treatments: ACDF C3-4 Discharge Exam: Blood pressure 142/88, pulse 105, temperature 98.5 F (36.9 C), temperature source Oral, resp. rate 20, height 5\' 6"  (1.676 m), weight 85.911 kg (189 lb 6.4 oz), SpO2 99 %. Strength out of 5 wound clean dry and intact  Disposition: Home     Medication List    TAKE these medications        CENTRUM SILVER ADULT 50+ PO  Take 1 capsule by mouth daily.     oxyCODONE-acetaminophen 5-325 MG per tablet  Commonly known as:  PERCOCET/ROXICET  Take 1 tablet by mouth every 4 (four) hours as needed for severe pain.     oxyCODONE-acetaminophen 5-325 MG per tablet  Commonly known as:  PERCOCET/ROXICET  Take 1-2 tablets by mouth every 4 (four) hours as needed for moderate pain.     predniSONE 50 MG tablet  Commonly known as:  DELTASONE  One tablet PO daily for 4 days     tamsulosin 0.4 MG Caps capsule  Commonly known as:  FLOMAX  Take 1 capsule by mouth daily.           Follow-up Information    Follow up with [facility], MD.   Specialty:  Neurosurgery   Contact information:   [address] [place] [address] [phone]       Signed: Elaina Hoops 07/08/2014, 7:32 AM

## 2014-11-15 ENCOUNTER — Emergency Department (HOSPITAL_COMMUNITY)
Admission: EM | Admit: 2014-11-15 | Discharge: 2014-11-15 | Disposition: A | Discharge status: Home / Self Care | Payer: Medicare Other | Attending: Emergency Medicine | Admitting: Emergency Medicine

## 2014-11-15 ENCOUNTER — Encounter (HOSPITAL_COMMUNITY): Payer: Self-pay | Admitting: *Deleted

## 2014-11-15 DIAGNOSIS — M542 Cervicalgia: Secondary | ICD-10-CM | POA: Insufficient documentation

## 2014-11-15 DIAGNOSIS — Z8669 Personal history of other diseases of the nervous system and sense organs: Secondary | ICD-10-CM | POA: Insufficient documentation

## 2014-11-15 DIAGNOSIS — M1990 Unspecified osteoarthritis, unspecified site: Secondary | ICD-10-CM | POA: Diagnosis not present

## 2014-11-15 DIAGNOSIS — Z85038 Personal history of other malignant neoplasm of large intestine: Secondary | ICD-10-CM | POA: Insufficient documentation

## 2014-11-15 DIAGNOSIS — Z79899 Other long term (current) drug therapy: Secondary | ICD-10-CM | POA: Diagnosis not present

## 2014-11-15 DIAGNOSIS — Z8619 Personal history of other infectious and parasitic diseases: Secondary | ICD-10-CM | POA: Diagnosis not present

## 2014-11-15 MED ORDER — CYCLOBENZAPRINE HCL 10 MG PO TABS: 10.0000 mg | ORAL_TABLET | Freq: Two times a day (BID) | ORAL | Status: DC | PRN

## 2014-11-15 NOTE — ED Provider Notes (Signed)
History  This chart was scribed for non-physician practitioner, Okey Regal, PA-C,working with Evelina Bucy, MD, by Marlowe Kays, ED Scribe. This patient was seen in room TR09C/TR09C and the patient's care was started at 1:52 PM.  Chief Complaint  Patient presents with  . Neck Injury   The history is provided by the patient and medical records. No language interpreter was used.    HPI Comments:   Eric Day is a 54 y.o. male who presents to the Emergency Department complaining of severe moderate neck pain that started worsening about 1 week ago. Pt reports there is a nodule on the back of his neck that has been there for several years that has been getting progressively worse over the past week. He states it feels as if there is something "crawling in it". He has been taking Advil to treat the pain unsuccessfully. Touching the area makes the pain worse. He denies alleviating factors. He denies fever, chills, nausea, vomiting, CP, SOB, numbness, tingling or weakness of BUE, HA, neck stiffness, warmth or redness of the area, trauma, injury or fall. Past surgical h/o cervical fusion and discectomy four months ago and has not seen his neurosurgeon, Dr. Saintclair Halsted since. Pt's PCP is at [place] and the first available appt per pt is 01/26/15 (almost three months from now). He endorses smoking.  Past Medical History  Diagnosis Date  . Colon cancer   . Eye disorder     rt artificial  . Tuberculosis     70's  . Arthritis    Past Surgical History  Procedure Laterality Date  . Abdominal surgery  90's    stomach?  . Eye surgery  90's  . Hernia repair      hernia as child  . Anterior cervical decomp/discectomy fusion N/A 07/07/2014    Procedure: ANTERIOR CERVICAL DECOMPRESSION/DISCECTOMY FUSION CERVICAL THREE-FOUR;  Surgeon: Kary Kos, MD;  Location: [facility];  Service: Neurosurgery;  Laterality: N/A;   No family history on file. History  Substance Use Topics  . Smoking status:  Never Smoker   . Smokeless tobacco: Not on file  . Alcohol Use: No     Comment: quit 16 yrs ago    Review of Systems  All other systems reviewed and are negative.   Allergies  Review of patient's allergies indicates no known allergies.  Home Medications   Prior to Admission medications   Medication Sig Start Date End Date Taking? Authorizing Provider  cyclobenzaprine (FLEXERIL) 10 MG tablet Take 1 tablet (10 mg total) by mouth 2 (two) times daily as needed for muscle spasms. 11/15/14   Okey Regal, PA-C  Multiple Vitamins-Minerals (CENTRUM SILVER ADULT 50+ PO) Take 1 capsule by mouth daily.    Historical Provider, MD  oxyCODONE-acetaminophen (PERCOCET/ROXICET) 5-325 MG per tablet Take 1 tablet by mouth every 4 (four) hours as needed for severe pain. 06/28/14   Ripley Fraise, MD  oxyCODONE-acetaminophen (PERCOCET/ROXICET) 5-325 MG per tablet Take 1-2 tablets by mouth every 4 (four) hours as needed for moderate pain. 07/08/14   Kary Kos, MD  predniSONE (DELTASONE) 50 MG tablet One tablet PO daily for 4 days 06/28/14   Ripley Fraise, MD  tamsulosin (FLOMAX) 0.4 MG CAPS capsule Take 1 capsule by mouth daily. 06/20/14   Historical Provider, MD   Triage Vitals: BP 122/82 mmHg  Pulse 102  Temp(Src) 97.3 F (36.3 C) (Oral)  Resp 18  SpO2 96%  Physical Exam  Constitutional: He is oriented to person, place, and time. He  appears well-developed and well-nourished.  HENT:  Head: Normocephalic and atraumatic.  Eyes: EOM are normal.  Neck: Normal range of motion.  Cardiovascular: Normal rate.   Pulmonary/Chest: Effort normal.  Musculoskeletal: Normal range of motion.  No C or T spinal tenderness, tenderness to paracervical muscles, greater on the left with trapezius tenderness. No obvious deformities, signs of infection. Nodule on upper back. Firm, mobile, nonfluctuant. Left shoulder nontender to palpation. Full ROM. 5/5 strength in LUE. Radial pulse 2+ Cap refill less than 3 seconds.   Neurological: He is alert and oriented to person, place, and time.  Skin: Skin is warm and dry.  Psychiatric: He has a normal mood and affect. His behavior is normal.  Nursing note and vitals reviewed.   ED Course  Procedures (including critical care time)  EMERGENCY DEPARTMENT US SOFT TISSUE INTERPRETATION "Study: Limited Ultrasound of the noted body part in comments below"  INDICATIONS: Soft tissue infection Multiple views of the body part are obtained with a multi-frequency linear probe  PERFORMED BY:  Myself  IMAGES ARCHIVED?: Yes  SIDE:Left  BODY PART:Upper back  FINDINGS: No abcess noted  LIMITATIONS:  Body Habitus  INTERPRETATION:  No abcess noted  COMMENT:    DIAGNOSTIC STUDIES: Oxygen Saturation is 96% on RA, normal by my interpretation.   COORDINATION OF CARE: 1:58 PM- Will perform bedside ultrasound to check for drainable infection. Pt verbalizes understanding and agrees to plan.  2:01 PM- Informed pt that no infection was detected through bedside ultrasound. Advised him to follow up with PCP or neurosurgeon as soon as possible.  Medications - No data to display  Labs Review Labs Reviewed - No data to display  Imaging Review No results found.   EKG Interpretation None      MDM   Final diagnoses:  Neck pain    Labs: n/a  Imaging: bedside u/s  Consults: n/a  Therapeutics:  Discharge Meds:   Assessment/Plan: Patient presents with neck pain, this is likely related to his previous cervical discectomy and patient reports a nodule on his left back there is been present for an extensive amount of time and reports associated pain with this. On palpation of the back the pain is equally distributed over the nodule and remaining trapezius. Bedside ultrasound shows no signs of abscess, no cellulitis of the skin. Patient is prescribed Flexeril, encouraged to use ibuprofen or Tylenol for pain, and encouraged follow-up with his neurosurgeon for further  evaluation and management. No significant neurological deficits on exam that would necessitate emergent imaging or consultation.  I personally performed the services described in this documentation, which was scribed in my presence. The recorded information has been reviewed and is accurate.    Okey Regal, PA-C 11/15/14 1625  Okey Regal, PA-C 11/15/14 1646  Evelina Bucy, MD 11/16/14 [phone]

## 2014-11-15 NOTE — ED Notes (Signed)
J Hedges, PA, in w/pt.

## 2014-11-15 NOTE — ED Notes (Signed)
Pt reports left neck pain that started 1 week ago. Pt reports hx of keloids and noted to have raised area to left side of neck. Pt states that this is that area of pain.

## 2014-11-15 NOTE — Discharge Instructions (Signed)
Cervical Sprain °A cervical sprain is an injury in the neck in which the strong, fibrous tissues (ligaments) that connect your neck bones stretch or tear. Cervical sprains can range from mild to severe. Severe cervical sprains can cause the neck vertebrae to be unstable. This can lead to damage of the spinal cord and can result in serious nervous system problems. The amount of time it takes for a cervical sprain to get better depends on the cause and extent of the injury. Most cervical sprains heal in 1 to 3 weeks. °CAUSES  °Severe cervical sprains may be caused by:  °· Contact sport injuries (such as from football, rugby, wrestling, hockey, auto racing, gymnastics, diving, martial arts, or boxing).   °· Motor vehicle collisions.   °· Whiplash injuries. This is an injury from a sudden forward and backward whipping movement of the head and neck.  °· Falls.   °Mild cervical sprains may be caused by:  °· Being in an awkward position, such as while cradling a telephone between your ear and shoulder.   °· Sitting in a chair that does not offer proper support.   °· Working at a poorly designed computer station.   °· Looking up or down for long periods of time.   °SYMPTOMS  °· Pain, soreness, stiffness, or a burning sensation in the front, back, or sides of the neck. This discomfort may develop immediately after the injury or slowly, 24 hours or more after the injury.   °· Pain or tenderness directly in the middle of the back of the neck.   °· Shoulder or upper back pain.   °· Limited ability to move the neck.   °· Headache.   °· Dizziness.   °· Weakness, numbness, or tingling in the hands or arms.   °· Muscle spasms.   °· Difficulty swallowing or chewing.   °· Tenderness and swelling of the neck.   °DIAGNOSIS  °Most of the time your health care provider can diagnose a cervical sprain by taking your history and doing a physical exam. Your health care provider will ask about previous neck injuries and any known neck  problems, such as arthritis in the neck. X-rays may be taken to find out if there are any other problems, such as with the bones of the neck. Other tests, such as a CT scan or MRI, may also be needed.  °TREATMENT  °Treatment depends on the severity of the cervical sprain. Mild sprains can be treated with rest, keeping the neck in place (immobilization), and pain medicines. Severe cervical sprains are immediately immobilized. Further treatment is done to help with pain, muscle spasms, and other symptoms and may include: °· Medicines, such as pain relievers, numbing medicines, or muscle relaxants.   °· Physical therapy. This may involve stretching exercises, strengthening exercises, and posture training. Exercises and improved posture can help stabilize the neck, strengthen muscles, and help stop symptoms from returning.   °HOME CARE INSTRUCTIONS  °· Put ice on the injured area.   °¨ Put ice in a plastic bag.   °¨ Place a towel between your skin and the bag.   °¨ Leave the ice on for 15-20 minutes, 3-4 times a day.   °· If your injury was severe, you may have been given a cervical collar to wear. A cervical collar is a two-piece collar designed to keep your neck from moving while it heals. °¨ Do not remove the collar unless instructed by your health care provider. °¨ If you have long hair, keep it outside of the collar. °¨ Ask your health care provider before making any adjustments to your collar. Minor   adjustments may be required over time to improve comfort and reduce pressure on your chin or on the back of your head.  Ifyou are allowed to remove the collar for cleaning or bathing, follow your health care provider's instructions on how to do so safely.  Keep your collar clean by wiping it with mild soap and water and drying it completely. If the collar you have been given includes removable pads, remove them every 1-2 days and hand wash them with soap and water. Allow them to air dry. They should be completely  dry before you wear them in the collar.  If you are allowed to remove the collar for cleaning and bathing, wash and dry the skin of your neck. Check your skin for irritation or sores. If you see any, tell your health care provider.  Do not drive while wearing the collar.   Only take over-the-counter or prescription medicines for pain, discomfort, or fever as directed by your health care provider.   Keep all follow-up appointments as directed by your health care provider.   Keep all physical therapy appointments as directed by your health care provider.   Make any needed adjustments to your workstation to promote good posture.   Avoid positions and activities that make your symptoms worse.   Warm up and stretch before being active to help prevent problems.  SEEK MEDICAL CARE IF:   Your pain is not controlled with medicine.   You are unable to decrease your pain medicine over time as planned.   Your activity level is not improving as expected.  SEEK IMMEDIATE MEDICAL CARE IF:   You develop any bleeding.  You develop stomach upset.  You have signs of an allergic reaction to your medicine.   Your symptoms get worse.   You develop new, unexplained symptoms.   You have numbness, tingling, weakness, or paralysis in any part of your body.  MAKE SURE YOU:   Understand these instructions.  Will watch your condition.  Will get help right away if you are not doing well or get worse. Document Released: 01/30/2007 Document Revised: 04/09/2013 Document Reviewed: 10/10/2012 [facility] Patient Information 2015 [place], [place]. This information is not intended to replace advice given to you by your health care provider. Make sure you discuss any questions you have with your health care provider.  Please follow-up with Dr. Saintclair Halsted for further evaluation and management. Please monitor for new or worsening signs or symptoms return immediately if any present.

## 2015-02-10 ENCOUNTER — Ambulatory Visit: Payer: Self-pay | Admitting: Surgery

## 2015-04-09 ENCOUNTER — Other Ambulatory Visit: Payer: Self-pay | Admitting: Urology

## 2015-04-10 ENCOUNTER — Other Ambulatory Visit: Payer: Self-pay | Admitting: Urology

## 2015-04-29 ENCOUNTER — Ambulatory Visit (HOSPITAL_BASED_OUTPATIENT_CLINIC_OR_DEPARTMENT_OTHER): Admission: RE | Admit: 2015-04-29 | Payer: Medicare Other | Source: Ambulatory Visit | Admitting: Surgery

## 2015-04-29 ENCOUNTER — Encounter (HOSPITAL_BASED_OUTPATIENT_CLINIC_OR_DEPARTMENT_OTHER): Admission: RE | Payer: Self-pay | Source: Ambulatory Visit

## 2015-04-29 SURGERY — EXCISION MASS
Anesthesia: Choice

## 2015-05-01 ENCOUNTER — Inpatient Hospital Stay (HOSPITAL_COMMUNITY): Admission: RE | Admit: 2015-05-01 | Payer: Medicare Other | Source: Ambulatory Visit

## 2015-05-04 ENCOUNTER — Encounter (HOSPITAL_COMMUNITY)
Admission: RE | Admit: 2015-05-04 | Discharge: 2015-05-04 | Disposition: A | Discharge status: Home / Self Care | Payer: Medicare Other | Source: Ambulatory Visit | Attending: Urology | Admitting: Urology

## 2015-05-04 ENCOUNTER — Ambulatory Visit (HOSPITAL_COMMUNITY)
Admission: RE | Admit: 2015-05-04 | Discharge: 2015-05-04 | Disposition: A | Discharge status: Home / Self Care | Payer: Medicare Other | Source: Ambulatory Visit | Attending: Urology | Admitting: Urology

## 2015-05-04 ENCOUNTER — Encounter (HOSPITAL_COMMUNITY): Payer: Self-pay

## 2015-05-04 DIAGNOSIS — Z01810 Encounter for preprocedural cardiovascular examination: Secondary | ICD-10-CM

## 2015-05-04 HISTORY — DX: Unspecified hearing loss, unspecified ear: H91.90

## 2015-05-04 HISTORY — DX: Acute myocardial infarction, unspecified: I21.9

## 2015-05-04 HISTORY — DX: Neurofibromatosis, unspecified: Q85.00

## 2015-05-04 HISTORY — DX: Major depressive disorder, single episode, unspecified: F32.9

## 2015-05-04 HISTORY — DX: Repeated falls: R29.6

## 2015-05-04 HISTORY — DX: Presence of artificial eye: Z97.0

## 2015-05-04 HISTORY — DX: Reserved for inherently not codable concepts without codable children: IMO0001

## 2015-05-04 HISTORY — DX: Presence of spectacles and contact lenses: Z97.3

## 2015-05-04 HISTORY — DX: Nocturia: R35.1

## 2015-05-04 HISTORY — DX: Depression, unspecified: F32.A

## 2015-05-04 HISTORY — DX: Legal blindness, as defined in USA: H54.8

## 2015-05-04 LAB — BASIC METABOLIC PANEL
Anion gap: 10 (ref 5–15)
BUN: 8 mg/dL (ref 6–20)
CO2: 27 mmol/L (ref 22–32)
CREATININE: 0.86 mg/dL (ref 0.61–1.24)
Calcium: 9.9 mg/dL (ref 8.9–10.3)
Chloride: 103 mmol/L (ref 101–111)
GFR calc Af Amer: >60 mL/min (ref >60)
GLUCOSE: 94 mg/dL (ref 65–99)
Potassium: 4.8 mmol/L (ref 3.5–5.1)
SODIUM: 140 mmol/L (ref 135–145)

## 2015-05-04 LAB — CBC
HCT: 51.5 % (ref 39.0–52.0)
Hemoglobin: 17 g/dL (ref 13.0–17.0)
MCH: 27.9 pg (ref 26.0–34.0)
MCHC: 33 g/dL (ref 30.0–36.0)
MCV: 84.6 fL (ref 78.0–100.0)
PLATELETS: 295 10*3/uL (ref 150–400)
RBC: 6.09 MIL/uL — ABNORMAL HIGH (ref 4.22–5.81)
RDW: 14.3 % (ref 11.5–15.5)
WBC: 7.8 10*3/uL (ref 4.0–10.5)

## 2015-05-04 LAB — ABO/RH: ABO/RH(D): O POS

## 2015-05-04 NOTE — Patient Instructions (Addendum)
DONNE MECKEL  05/04/2015   Your procedure is scheduled on: Thursday May 07, 2015  Report to [facility]  Entrance take [place]  elevators to 3rd floor to  [place] at 10:00 AM.  Call this number if you have problems the morning of surgery [phone]   Remember: ONLY 1 PERSON MAY GO WITH YOU TO SHORT STAY TO GET  READY MORNING OF [place].  Do not eat food or drink liquids :After Midnight.   FOLLOW SURGEON'S INSTRUCTION IN REGARDS TO BOWEL PREPARATION PRIOR TO SURGERY DATE   Take these medicines the morning of surgery with A SIP OF WATER: NONE                               You may not have any metal on your body including hair pins and              piercings  Do not wear jewelry lotions, powders or colognes, deodorant                          Men may shave face and neck.   Do not bring valuables to the hospital. [place].  Contacts, dentures or bridgework may not be worn into surgery.  Leave suitcase in the car. After surgery it may be brought to your room.                Please read over the following fact sheets you were given:INCENTIVE SPIROMETER; BLOOD TRANSFUSION INFORMATION SHEET  _____________________________________________________________________             [facility] - Preparing for Surgery Before surgery, you can play an important role.  Because skin is not sterile, your skin needs to be as free of germs as possible.  You can reduce the number of germs on your skin by washing with CHG (chlorahexidine gluconate) soap before surgery.  CHG is an antiseptic cleaner which kills germs and bonds with the skin to continue killing germs even after washing. Please DO NOT use if you have an allergy to CHG or antibacterial soaps.  If your skin becomes reddened/irritated stop using the CHG and inform your nurse when you arrive at Short Stay. Do not shave (including legs and underarms) for  at least 48 hours prior to the first CHG shower.  You may shave your face/neck. Please follow these instructions carefully:  1.  Shower with CHG Soap the night before surgery and the  morning of Surgery.  2.  If you choose to wash your hair, wash your hair first as usual with your  normal  shampoo.  3.  After you shampoo, rinse your hair and body thoroughly to remove the  shampoo.                           4.  Use CHG as you would any other liquid soap.  You can apply chg directly  to the skin and wash                       Gently with a scrungie or clean washcloth.  5.  Apply the  CHG Soap to your body ONLY FROM THE NECK DOWN.   Do not use on face/ open                           Wound or open sores. Avoid contact with eyes, ears mouth and genitals (private parts).                       Wash face,  Genitals (private parts) with your normal soap.             6.  Wash thoroughly, paying special attention to the area where your surgery  will be performed.  7.  Thoroughly rinse your body with warm water from the neck down.  8.  DO NOT shower/wash with your normal soap after using and rinsing off  the CHG Soap.                9.  Pat yourself dry with a clean towel.            10.  Wear clean pajamas.            11.  Place clean sheets on your bed the night of your first shower and do not  sleep with pets. Day of Surgery : Do not apply any lotions/deodorants the morning of surgery.  Please wear clean clothes to the hospital/surgery center.  FAILURE TO FOLLOW THESE INSTRUCTIONS MAY RESULT IN THE CANCELLATION OF YOUR SURGERY PATIENT SIGNATURE_________________________________  NURSE SIGNATURE__________________________________  ________________________________________________________________________   Adam Phenix  An incentive spirometer is a tool that can help keep your lungs clear and active. This tool measures how well you are filling your lungs with each breath. Taking long deep  breaths may help reverse or decrease the chance of developing breathing (pulmonary) problems (especially infection) following:  A long period of time when you are unable to move or be active. BEFORE THE PROCEDURE   If the spirometer includes an indicator to show your best effort, your nurse or respiratory therapist will set it to a desired goal.  If possible, sit up straight or lean slightly forward. Try not to slouch.  Hold the incentive spirometer in an upright position. INSTRUCTIONS FOR USE   Sit on the edge of your bed if possible, or sit up as far as you can in bed or on a chair.  Hold the incentive spirometer in an upright position.  Breathe out normally.  Place the mouthpiece in your mouth and seal your lips tightly around it.  Breathe in slowly and as deeply as possible, raising the piston or the ball toward the top of the column.  Hold your breath for 3-5 seconds or for as long as possible. Allow the piston or ball to fall to the bottom of the column.  Remove the mouthpiece from your mouth and breathe out normally.  Rest for a few seconds and repeat Steps 1 through 7 at least 10 times every 1-2 hours when you are awake. Take your time and take a few normal breaths between deep breaths.  The spirometer may include an indicator to show your best effort. Use the indicator as a goal to work toward during each repetition.  After each set of 10 deep breaths, practice coughing to be sure your lungs are clear. If you have an incision (the cut made at the time of surgery), support your incision when coughing by placing a pillow or rolled up  towels firmly against it. Once you are able to get out of bed, walk around indoors and cough well. You may stop using the incentive spirometer when instructed by your caregiver.  RISKS AND COMPLICATIONS  Take your time so you do not get dizzy or light-headed.  If you are in pain, you may need to take or ask for pain medication before doing  incentive spirometry. It is harder to take a deep breath if you are having pain. AFTER USE  Rest and breathe slowly and easily.  It can be helpful to keep track of a log of your progress. Your caregiver can provide you with a simple table to help with this. If you are using the spirometer at home, follow these instructions: [place] IF:   You are having difficultly using the spirometer.  You have trouble using the spirometer as often as instructed.  Your pain medication is not giving enough relief while using the spirometer.  You develop fever of 100.5 F (38.1 C) or higher. SEEK IMMEDIATE MEDICAL CARE IF:   You cough up bloody sputum that had not been present before.  You develop fever of 102 F (38.9 C) or greater.  You develop worsening pain at or near the incision site. MAKE SURE YOU:   Understand these instructions.  Will watch your condition.  Will get help right away if you are not doing well or get worse. Document Released: 08/15/2006 Document Revised: 06/27/2011 Document Reviewed: 10/16/2006 ExitCare Patient Information 2014 ExitCare, [place].   ________________________________________________________________________  WHAT IS A BLOOD TRANSFUSION? Blood Transfusion Information  A transfusion is the replacement of blood or some of its parts. Blood is made up of multiple cells which provide different functions.  Red blood cells carry oxygen and are used for blood loss replacement.  [place] blood cells fight against infection.  Platelets control bleeding.  Plasma helps clot blood.  Other blood products are available for specialized needs, such as hemophilia or other clotting disorders. BEFORE THE TRANSFUSION  Who gives blood for transfusions?   Healthy volunteers who are fully evaluated to make sure their blood is safe. This is blood bank blood. Transfusion therapy is the safest it has ever been in the practice of medicine. Before blood is taken from a  donor, a complete history is taken to make sure that person has no history of diseases nor engages in risky social behavior (examples are intravenous drug use or sexual activity with multiple partners). The donor's travel history is screened to minimize risk of transmitting infections, such as malaria. The donated blood is tested for signs of infectious diseases, such as HIV and hepatitis. The blood is then tested to be sure it is compatible with you in order to minimize the chance of a transfusion reaction. If you or a relative donates blood, this is often done in anticipation of surgery and is not appropriate for emergency situations. It takes many days to process the donated blood. RISKS AND COMPLICATIONS Although transfusion therapy is very safe and saves many lives, the main dangers of transfusion include:   Getting an infectious disease.  Developing a transfusion reaction. This is an allergic reaction to something in the blood you were given. Every precaution is taken to prevent this. The decision to have a blood transfusion has been considered carefully by your caregiver before blood is given. Blood is not given unless the benefits outweigh the risks. AFTER THE TRANSFUSION  Right after receiving a blood transfusion, you will usually feel much  better and more energetic. This is especially true if your red blood cells have gotten low (anemic). The transfusion raises the level of the red blood cells which carry oxygen, and this usually causes an energy increase.  The nurse administering the transfusion will monitor you carefully for complications. HOME CARE INSTRUCTIONS  No special instructions are needed after a transfusion. You may find your energy is better. Speak with your caregiver about any limitations on activity for underlying diseases you may have. SEEK MEDICAL CARE IF:   Your condition is not improving after your transfusion.  You develop redness or irritation at the intravenous (IV)  site. SEEK IMMEDIATE MEDICAL CARE IF:  Any of the following symptoms occur over the next 12 hours:  Shaking chills.  You have a temperature by mouth above 102 F (38.9 C), not controlled by medicine.  Chest, back, or muscle pain.  People around you feel you are not acting correctly or are confused.  Shortness of breath or difficulty breathing.  Dizziness and fainting.  You get a rash or develop hives.  You have a decrease in urine output.  Your urine turns a dark color or changes to pink, red, or brown. Any of the following symptoms occur over the next 10 days:  You have a temperature by mouth above 102 F (38.9 C), not controlled by medicine.  Shortness of breath.  Weakness after normal activity.  The [place] part of the eye turns yellow (jaundice).  You have a decrease in the amount of urine or are urinating less often.  Your urine turns a dark color or changes to pink, red, or brown. Document Released: 04/01/2000 Document Revised: 06/27/2011 Document Reviewed: 11/19/2007 [facility] Patient Information 2014 [place], [place].  _______________________________________________________________________

## 2015-05-04 NOTE — Progress Notes (Signed)
EKG/epic 06/29/2014

## 2015-05-05 NOTE — Progress Notes (Addendum)
Pt notified of surgical time change and need to arrive at Short Stay here at [facility] hospital at 9:30 am on 05/07/2015. Also same information given to pts caregiver [name].

## 2015-05-06 NOTE — H&P (Signed)
Chief Complaint Prostate Cancer   Reason For Visit Reason for consult: To discuss treatment options for prostate cancer and specifically to consider a robotic prostatectomy.  Physician requesting consult: Eric Day  PCP: Dr. Antony Day   History of Present Illness Eric Day is a 55 year old gentleman with a history of BPH and LUTS followed by Dr. Risa Day and managed with finasteride and tamsulosin. He was noted to have a rise in his PSA after starting finasteride to 3.7 prompting a TRUS biopsy of the prostate on 03/02/15 which demonstrated Gleason 3+3=6 adenocarcinoma of the prostate with 3 out of 12 biopsy cores positive. He has no family history of prostate cancer. Eric Day does live alone although does require some help for his care. 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 He has a friend, Eric Day, who acts as his caretaker and helper with his medical visits. He also has a sister, Eric Day, who is also on his medical release. He is his own power of attorney.    ** He does have a history colon cancer although has had no evidence for disease recurrence. He is on sure about the details of his colon surgery although this was performed via a large midline incision when he was living in [place]. He also gives a history of a transverse periumbilical incision performed when he was a young child that may have been related to a hernia or omphalocele.    TNM stage: cT1c Nx Mx  PSA: 3.7 (on 5 ARI)  Gleason score: 3+3=6  Biopsy (03/03/15): 3/12 cores positive -- L lateral apex (20%), L apex (30%), L lateral mid (5%)  Prostate volume: 101 cc  PSAD: 0.07    Urinary function: He does have significant lower urinary tract symptoms and has been treated with tamsulosin, finasteride, and Myrbetriq by Dr. Risa Day. Currently, he gives a history of only taking tamsulosin 0.4 mg. His symptoms include a sense of incomplete emptying, frequency, intermittency, weak stream, and nocturia. IPSS is  25. He is significantly bothered by his symptoms despite alpha blocker therapy.  Erectile function: He does have severe erectile dysfunction. He has been refractory to PDE 5 inhibitors. He was unable to complete his SHIM score due to his inability fully comprehend the questions.     Past Medical History Problems  1. History of Essential hypertension, benign (I10) 2. History of Hearing deficit (H91.90) 3. History of cataract (Z86.69) 4. History of glaucoma (Z86.69) 5. History of malignant neoplasm of colon (Z85.038) 6. History of mixed hyperlipidemia (Z86.39) 7. History of schizophrenia (Z86.59) 8. History of Legally blind in right eye, as defined in [place] (H54.8)  He has a history of schizophrenia although apparently no longer requires medical therapy.   Surgical History Problems  1. History of Colon Surgery 2. History of Insertion Of Ocular Implant Into The Right Orbit 3. History of Neck Surgery 4. History of Umbilical Hernia Repair  He has undergone a midline incision for a partial colectomy.   Current Meds 1. Advil 200 MG Oral Capsule;  Therapy: (Recorded:13Jul2015) to Recorded 2. Finasteride 5 MG Oral Tablet; Take 1 tablet every day;  Therapy: 23Oct2015 to (Evaluate:17Oct2016)  Requested for: 23Oct2015; Last  Rx:23Oct2015 Ordered 3. Multi-Vitamin TABS;  Therapy: (Recorded:13Jul2015) to Recorded 4. Myrbetriq 50 MG Oral Tablet Extended Release 24 Hour; Take 1 tablet every day;  Therapy: [id number] to (Evaluate:17Sep2015); Last Rx:18Aug2015 Ordered 5. Tamsulosin HCl - 0.4 MG Oral Capsule; TAKE 1 CAPSULE EVERY DAY;  Therapy: [id number] to (Evaluate:20Dec2016)  Requested for: 22Sep2016; Last  Rx:21Sep2016 Ordered 6. Travatan Z 0.004 % Ophthalmic Solution;  Therapy: (Recorded:13Jul2015) to Recorded 7. Vitamin A TABS;  Therapy: (Recorded:13Jul2015) to Recorded  Allergies Medication  1. No Known Drug Allergies  Family History Problems  1. Family history of diabetes  mellitus (Z83.3) : Brother 2. Family history of hypertension (Z82.49) : Father, Mother 3. Family history of lung cancer (Z80.1) : Father 4. 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 He does smoke 1 pack of cigarettes every 2 days.   Review of Systems Genitourinary, constitutional, skin, eye, otolaryngeal, hematologic/lymphatic, cardiovascular, pulmonary, endocrine, musculoskeletal, gastrointestinal, neurological and psychiatric system(s) were reviewed and pertinent findings if present are noted and are otherwise negative.  Constitutional: no recent weight loss.  Cardiovascular: no chest pain and no leg swelling.  Respiratory: no shortness of breath.    Physical Exam Constitutional: Well nourished and well developed . No acute distress.  ENT:. The ears and nose are normal in appearance.  Neck: The appearance of the neck is normal and no neck mass is present.  Pulmonary: No respiratory distress, normal respiratory rhythm and effort and clear bilateral breath sounds.  Cardiovascular: Heart rate and rhythm are normal . No peripheral edema.  Abdomen: periumbilical, midline incision site(s) well healed. The abdomen is soft and nontender. No masses are palpated. No CVA tenderness. No hernias are palpable. No hepatosplenomegaly noted.  Rectal: Prostate size is estimated to be 80 g. The prostate has no nodularity and is not indurated.  Genitourinary: Examination of the penis demonstrates no discharge, no masses, no lesions and a normal meatus. The scrotum is without lesions. The right epididymis is palpably normal and non-tender. The left epididymis is palpably normal and non-tender. The right testis is non-tender and  without masses. The left testis is non-tender and without masses.  Lymphatics: The femoral and inguinal nodes are not enlarged or tender.  Skin: Normal skin turgor, no visible rash and no visible skin lesions.  Neuro/Psych:. Mood and affect are appropriate.    Results/Data He was unable to void today.    I have reviewed his medical records, PSA results, and pathology report. Findings are as dictated above.     Assessment Assessed  1. Adenocarcinoma of prostate (C61)  Plan Adenocarcinoma of prostate  1. Follow-up Office  Follow-up  Status: Hold For - Appointment,Date of Service  Requested  for: 20Dec2016 2. PT/OT Referral Referral  Referral  Status: Hold For - Appointment,PreCert,Date of  Service,Physical Therapy  Requested for: 20Dec2016  Discussion/Summary 1. Prostate cancer: I had a detailed discussion with Eric Day and his friend, Eric Day, today. It was explained that he does have low risk prostate cancer that is not likely to be a life-threatening problem to him in the near future. However, considering his long life expectancy, he understands that this very likely could present a major health issue for him eventually. We therefore discussed options including active surveillance with delayed intervention versus therapy of curative intent. We reviewed the pros and cons of each of these approaches. Central to the patient's decision appears to be has bothersome voiding symptoms. In addition, he already has severe erectile dysfunction refractory to medication. He also places  a priority on his longevity. He therefore wishes to proceed with definitive therapy and is most interested in a surgical approach to also treat his enlarged prostate and voiding symptoms.   The patient was counseled about the natural history of prostate cancer and the standard treatment options that are available for prostate cancer. It was explained to him how his age and life expectancy, clinical stage, Gleason score,  and PSA affect his prognosis, the decision to proceed with additional staging studies, as well as how that information influences recommended treatment strategies. We discussed the roles for active surveillance, radiation therapy, surgical therapy, androgen deprivation, as well as ablative therapy options for the treatment of prostate cancer as appropriate to his individual cancer situation. We discussed the risks and benefits of these options with regard to their impact on cancer control and also in terms of potential adverse events, complications, and impact on quiality of life particularly related to urinary, bowel, and sexual function. The patient was encouraged to ask questions throughout the discussion today and all questions were answered to his stated satisfaction. In addition, the patient was provided with and/or directed to appropriate resources and literature for further education about prostate cancer and treatment options.   We discussed surgical therapy for prostate cancer including the different available surgical approaches. We discussed, in detail, the risks and expectations of surgery with regard to cancer control, urinary control, and erectile function as well as the expected postoperative recovery process. Additional risks of surgery including but not limited to bleeding, infection, hernia formation, nerve damage, lymphocele formation, bowel/rectal injury potentially necessitating colostomy, damage to the urinary tract resulting in urine leakage, urethral stricture, and the cardiopulmonary risks such as myocardial infarction, stroke, death, venothromboembolism, etc. were explained. The risk of open surgical conversion for robotic/laparoscopic prostatectomy was also discussed. He understands the significantly increased risk for open surgical conversion considering his prior surgical history.    He will tentatively be scheduled for a bilateral nerve sparing robot-assisted laparoscopic radical  prostatectomy with a very high risk for open surgical conversion. We discussed the expected postoperative recovery and he understands that this may be influenced by the type of procedure that he is able to undergo.    Cc: Eric Day  Dr. Antony Day    SignaturesElectronically signed by : Raynelle Bring, M.D.; Apr 07 2015  4:52PM EST

## 2015-05-07 ENCOUNTER — Encounter (HOSPITAL_COMMUNITY)
Admission: RE | Disposition: A | Discharge status: Home / Self Care | Payer: Self-pay | Source: Ambulatory Visit | Attending: Urology

## 2015-05-07 ENCOUNTER — Inpatient Hospital Stay (HOSPITAL_COMMUNITY): Payer: Medicare Other | Admitting: Certified Registered"

## 2015-05-07 ENCOUNTER — Encounter (HOSPITAL_COMMUNITY): Payer: Self-pay | Admitting: *Deleted

## 2015-05-07 ENCOUNTER — Inpatient Hospital Stay (HOSPITAL_COMMUNITY)
Admission: RE | Admit: 2015-05-07 | Discharge: 2015-05-12 | DRG: 707 | Disposition: A | Discharge status: Home / Self Care | Payer: Medicare Other | Source: Ambulatory Visit | Attending: Urology | Admitting: Urology

## 2015-05-07 DIAGNOSIS — E782 Mixed hyperlipidemia: Secondary | ICD-10-CM | POA: Diagnosis present

## 2015-05-07 DIAGNOSIS — N529 Male erectile dysfunction, unspecified: Secondary | ICD-10-CM | POA: Diagnosis present

## 2015-05-07 DIAGNOSIS — K567 Ileus, unspecified: Secondary | ICD-10-CM | POA: Diagnosis not present

## 2015-05-07 DIAGNOSIS — K9172 Accidental puncture and laceration of a digestive system organ or structure during other procedure: Secondary | ICD-10-CM | POA: Diagnosis not present

## 2015-05-07 DIAGNOSIS — Z9049 Acquired absence of other specified parts of digestive tract: Secondary | ICD-10-CM | POA: Diagnosis not present

## 2015-05-07 DIAGNOSIS — F17210 Nicotine dependence, cigarettes, uncomplicated: Secondary | ICD-10-CM | POA: Diagnosis present

## 2015-05-07 DIAGNOSIS — J9811 Atelectasis: Secondary | ICD-10-CM | POA: Diagnosis not present

## 2015-05-07 DIAGNOSIS — Z79899 Other long term (current) drug therapy: Secondary | ICD-10-CM

## 2015-05-07 DIAGNOSIS — Z833 Family history of diabetes mellitus: Secondary | ICD-10-CM | POA: Diagnosis not present

## 2015-05-07 DIAGNOSIS — C61 Malignant neoplasm of prostate: Secondary | ICD-10-CM | POA: Diagnosis present

## 2015-05-07 DIAGNOSIS — R0902 Hypoxemia: Secondary | ICD-10-CM | POA: Diagnosis not present

## 2015-05-07 DIAGNOSIS — H409 Unspecified glaucoma: Secondary | ICD-10-CM | POA: Diagnosis present

## 2015-05-07 DIAGNOSIS — K660 Peritoneal adhesions (postprocedural) (postinfection): Secondary | ICD-10-CM | POA: Diagnosis present

## 2015-05-07 DIAGNOSIS — I10 Essential (primary) hypertension: Secondary | ICD-10-CM | POA: Diagnosis present

## 2015-05-07 DIAGNOSIS — Z23 Encounter for immunization: Secondary | ICD-10-CM

## 2015-05-07 DIAGNOSIS — H5441 Blindness, right eye, normal vision left eye: Secondary | ICD-10-CM | POA: Diagnosis present

## 2015-05-07 DIAGNOSIS — Z85038 Personal history of other malignant neoplasm of large intestine: Secondary | ICD-10-CM | POA: Diagnosis not present

## 2015-05-07 DIAGNOSIS — Z8249 Family history of ischemic heart disease and other diseases of the circulatory system: Secondary | ICD-10-CM

## 2015-05-07 HISTORY — PX: ROBOT ASSISTED LAPAROSCOPIC RADICAL PROSTATECTOMY: SHX5141

## 2015-05-07 LAB — TYPE AND SCREEN
ABO/RH(D): O POS
ANTIBODY SCREEN: NEGATIVE

## 2015-05-07 LAB — HEMOGLOBIN AND HEMATOCRIT, BLOOD
HEMATOCRIT: 44.9 % (ref 39.0–52.0)
HEMOGLOBIN: 14.7 g/dL (ref 13.0–17.0)

## 2015-05-07 SURGERY — ROBOTIC ASSISTED LAPAROSCOPIC RADICAL PROSTATECTOMY LEVEL 3
Anesthesia: General | Site: Abdomen

## 2015-05-07 MED ORDER — MORPHINE SULFATE (PF) 2 MG/ML IV SOLN
2.0000 mg | INTRAVENOUS | Status: DC | PRN
  Administered 2015-05-08: 2 mg via INTRAVENOUS
  Filled 2015-05-07: qty 1

## 2015-05-07 MED ORDER — HYDROCODONE-ACETAMINOPHEN 7.5-325 MG PO TABS
1.0000 | ORAL_TABLET | Freq: Once | ORAL | Status: DC | PRN
Start: 2015-05-07 — End: 2015-05-07

## 2015-05-07 MED ORDER — DEXAMETHASONE SODIUM PHOSPHATE 10 MG/ML IJ SOLN
INTRAMUSCULAR | Status: DC | PRN
  Administered 2015-05-07: 5 mg via INTRAVENOUS

## 2015-05-07 MED ORDER — ACETAMINOPHEN 325 MG PO TABS
650.0000 mg | ORAL_TABLET | ORAL | Status: DC | PRN
  Administered 2015-05-09 – 2015-05-10 (×2): 650 mg via ORAL
  Filled 2015-05-07 (×2): qty 2

## 2015-05-07 MED ORDER — SODIUM CHLORIDE 0.9 % IV BOLUS (SEPSIS)
1000.0000 mL | Freq: Once | INTRAVENOUS | Status: AC
  Administered 2015-05-07: 1000 mL via INTRAVENOUS

## 2015-05-07 MED ORDER — ESMOLOL HCL 100 MG/10ML IV SOLN
INTRAVENOUS | Status: DC | PRN
  Administered 2015-05-07: 20 mg via INTRAVENOUS

## 2015-05-07 MED ORDER — CEFAZOLIN SODIUM-DEXTROSE 2-3 GM-% IV SOLR: 2.0000 g | INTRAVENOUS | Status: DC

## 2015-05-07 MED ORDER — HYDROCODONE-ACETAMINOPHEN 5-325 MG PO TABS: 1.0000 | ORAL_TABLET | Freq: Four times a day (QID) | ORAL | Status: DC | PRN

## 2015-05-07 MED ORDER — ESMOLOL HCL 100 MG/10ML IV SOLN
INTRAVENOUS | Status: AC
  Filled 2015-05-07: qty 10

## 2015-05-07 MED ORDER — DEXTROSE 5 % IV SOLN
2.0000 g | INTRAVENOUS | Status: DC | PRN
  Administered 2015-05-07: 2 g via INTRAVENOUS

## 2015-05-07 MED ORDER — LIDOCAINE HCL (CARDIAC) 20 MG/ML IV SOLN
INTRAVENOUS | Status: DC | PRN
  Administered 2015-05-07: 60 mg via INTRAVENOUS

## 2015-05-07 MED ORDER — CEFAZOLIN SODIUM-DEXTROSE 2-3 GM-% IV SOLR
2.0000 g | INTRAVENOUS | Status: AC
  Administered 2015-05-07: 2 g via INTRAVENOUS

## 2015-05-07 MED ORDER — BUPIVACAINE-EPINEPHRINE (PF) 0.25% -1:200000 IJ SOLN
INTRAMUSCULAR | Status: AC
  Filled 2015-05-07: qty 30

## 2015-05-07 MED ORDER — CEFAZOLIN SODIUM 1-5 GM-% IV SOLN
1.0000 g | Freq: Three times a day (TID) | INTRAVENOUS | Status: AC
  Administered 2015-05-07 – 2015-05-08 (×2): 1 g via INTRAVENOUS
  Filled 2015-05-07 (×2): qty 50

## 2015-05-07 MED ORDER — SUGAMMADEX SODIUM 200 MG/2ML IV SOLN
INTRAVENOUS | Status: AC
  Filled 2015-05-07: qty 2

## 2015-05-07 MED ORDER — INFLUENZA VAC SPLIT QUAD 0.5 ML IM SUSY
0.5000 mL | PREFILLED_SYRINGE | INTRAMUSCULAR | Status: AC
  Administered 2015-05-08: 0.5 mL via INTRAMUSCULAR
  Filled 2015-05-07 (×2): qty 0.5

## 2015-05-07 MED ORDER — SODIUM CHLORIDE 0.9 % IR SOLN
Status: DC | PRN
  Administered 2015-05-07: 1000 mL

## 2015-05-07 MED ORDER — ROCURONIUM BROMIDE 100 MG/10ML IV SOLN
INTRAVENOUS | Status: DC | PRN
  Administered 2015-05-07: 30 mg via INTRAVENOUS
  Administered 2015-05-07: 50 mg via INTRAVENOUS
  Administered 2015-05-07: 30 mg via INTRAVENOUS
  Administered 2015-05-07: 20 mg via INTRAVENOUS

## 2015-05-07 MED ORDER — HYDROMORPHONE HCL 1 MG/ML IJ SOLN
0.2500 mg | INTRAMUSCULAR | Status: DC | PRN
  Administered 2015-05-07 (×2): 0.5 mg via INTRAVENOUS

## 2015-05-07 MED ORDER — KETOROLAC TROMETHAMINE 15 MG/ML IJ SOLN
15.0000 mg | Freq: Four times a day (QID) | INTRAMUSCULAR | Status: AC
  Administered 2015-05-07 – 2015-05-09 (×6): 15 mg via INTRAVENOUS
  Filled 2015-05-07 (×6): qty 1

## 2015-05-07 MED ORDER — HYDROMORPHONE HCL 1 MG/ML IJ SOLN
INTRAMUSCULAR | Status: AC
  Filled 2015-05-07: qty 1

## 2015-05-07 MED ORDER — SULFAMETHOXAZOLE-TRIMETHOPRIM 800-160 MG PO TABS: 1.0000 | ORAL_TABLET | Freq: Two times a day (BID) | ORAL | Status: DC

## 2015-05-07 MED ORDER — FENTANYL CITRATE (PF) 100 MCG/2ML IJ SOLN
INTRAMUSCULAR | Status: DC | PRN
  Administered 2015-05-07 (×4): 50 ug via INTRAVENOUS
  Administered 2015-05-07 (×2): 25 ug via INTRAVENOUS
  Administered 2015-05-07 (×2): 50 ug via INTRAVENOUS

## 2015-05-07 MED ORDER — ONDANSETRON HCL 4 MG/2ML IJ SOLN
INTRAMUSCULAR | Status: DC | PRN
  Administered 2015-05-07 (×2): 4 mg via INTRAVENOUS

## 2015-05-07 MED ORDER — MEPERIDINE HCL 50 MG/ML IJ SOLN
INTRAMUSCULAR | Status: AC
  Filled 2015-05-07: qty 1

## 2015-05-07 MED ORDER — DIPHENHYDRAMINE HCL 12.5 MG/5ML PO ELIX: 12.5000 mg | ORAL_SOLUTION | Freq: Four times a day (QID) | ORAL | Status: DC | PRN

## 2015-05-07 MED ORDER — CEFOXITIN SODIUM 2 G IV SOLR
INTRAVENOUS | Status: AC
  Filled 2015-05-07: qty 2

## 2015-05-07 MED ORDER — DIPHENHYDRAMINE HCL 50 MG/ML IJ SOLN: 12.5000 mg | Freq: Four times a day (QID) | INTRAMUSCULAR | Status: DC | PRN

## 2015-05-07 MED ORDER — LIDOCAINE HCL (CARDIAC) 20 MG/ML IV SOLN
INTRAVENOUS | Status: AC
  Filled 2015-05-07: qty 5

## 2015-05-07 MED ORDER — CEFAZOLIN SODIUM-DEXTROSE 2-3 GM-% IV SOLR
INTRAVENOUS | Status: AC
  Filled 2015-05-07: qty 50

## 2015-05-07 MED ORDER — CHLORHEXIDINE GLUCONATE 4 % EX LIQD: 1.0000 "application " | Freq: Once | CUTANEOUS | Status: DC

## 2015-05-07 MED ORDER — HEPARIN SODIUM (PORCINE) 1000 UNIT/ML IJ SOLN
INTRAMUSCULAR | Status: AC
  Filled 2015-05-07: qty 1

## 2015-05-07 MED ORDER — CHLORHEXIDINE GLUCONATE 4 % EX LIQD
1.0000 | Freq: Once | CUTANEOUS | Status: DC
Start: 2015-05-08 — End: 2015-05-07

## 2015-05-07 MED ORDER — KCL IN DEXTROSE-NACL 20-5-0.45 MEQ/L-%-% IV SOLN
INTRAVENOUS | Status: DC
  Administered 2015-05-07 – 2015-05-11 (×8): via INTRAVENOUS
  Filled 2015-05-07 (×10): qty 1000

## 2015-05-07 MED ORDER — FENTANYL CITRATE (PF) 250 MCG/5ML IJ SOLN
INTRAMUSCULAR | Status: AC
  Filled 2015-05-07: qty 5

## 2015-05-07 MED ORDER — SUGAMMADEX SODIUM 200 MG/2ML IV SOLN
INTRAVENOUS | Status: DC | PRN
  Administered 2015-05-07: 200 mg via INTRAVENOUS

## 2015-05-07 MED ORDER — PROPOFOL 10 MG/ML IV BOLUS
INTRAVENOUS | Status: AC
  Filled 2015-05-07: qty 20

## 2015-05-07 MED ORDER — ONDANSETRON HCL 4 MG/2ML IJ SOLN
INTRAMUSCULAR | Status: AC
  Filled 2015-05-07: qty 4

## 2015-05-07 MED ORDER — DEXTROSE 5 % IV SOLN
2.0000 g | Freq: Three times a day (TID) | INTRAVENOUS | Status: DC
  Filled 2015-05-07 (×2): qty 2

## 2015-05-07 MED ORDER — STERILE WATER FOR IRRIGATION IR SOLN
Status: DC | PRN
  Administered 2015-05-07: 1000 mL

## 2015-05-07 MED ORDER — FENTANYL CITRATE (PF) 100 MCG/2ML IJ SOLN
INTRAMUSCULAR | Status: AC
  Filled 2015-05-07: qty 2

## 2015-05-07 MED ORDER — PROPOFOL 10 MG/ML IV BOLUS
INTRAVENOUS | Status: DC | PRN
  Administered 2015-05-07: 200 mg via INTRAVENOUS

## 2015-05-07 MED ORDER — LACTATED RINGERS IV SOLN
INTRAVENOUS | Status: DC | PRN
  Administered 2015-05-07 (×3): via INTRAVENOUS

## 2015-05-07 MED ORDER — DEXTROSE 5 % IV SOLN
INTRAVENOUS | Status: AC
  Filled 2015-05-07: qty 50

## 2015-05-07 MED ORDER — DOCUSATE SODIUM 100 MG PO CAPS
100.0000 mg | ORAL_CAPSULE | Freq: Two times a day (BID) | ORAL | Status: DC
  Administered 2015-05-08 – 2015-05-12 (×9): 100 mg via ORAL
  Filled 2015-05-07 (×10): qty 1

## 2015-05-07 MED ORDER — CETYLPYRIDINIUM CHLORIDE 0.05 % MT LIQD
7.0000 mL | Freq: Two times a day (BID) | OROMUCOSAL | Status: DC
  Administered 2015-05-07 – 2015-05-11 (×7): 7 mL via OROMUCOSAL

## 2015-05-07 MED ORDER — MEPERIDINE HCL 50 MG/ML IJ SOLN
6.2500 mg | INTRAMUSCULAR | Status: DC | PRN
  Administered 2015-05-07: 12.5 mg via INTRAVENOUS

## 2015-05-07 MED ORDER — DEXAMETHASONE SODIUM PHOSPHATE 10 MG/ML IJ SOLN
INTRAMUSCULAR | Status: AC
  Filled 2015-05-07: qty 1

## 2015-05-07 MED ORDER — HEPARIN SODIUM (PORCINE) 1000 UNIT/ML IJ SOLN
INTRAMUSCULAR | Status: DC | PRN
  Administered 2015-05-07: 14:00:00

## 2015-05-07 MED ORDER — PROMETHAZINE HCL 25 MG/ML IJ SOLN: 6.2500 mg | INTRAMUSCULAR | Status: DC | PRN

## 2015-05-07 SURGICAL SUPPLY — 54 items
APPLICATOR COTTON TIP 6IN STRL (MISCELLANEOUS) ×2 IMPLANT
CATH FOLEY 2WAY SLVR 18FR 30CC (CATHETERS) ×2 IMPLANT
CATH ROBINSON RED A/P 16FR (CATHETERS) ×2 IMPLANT
CATH ROBINSON RED A/P 8FR (CATHETERS) ×2 IMPLANT
CATH TIEMANN FOLEY 18FR 5CC (CATHETERS) ×2 IMPLANT
CHLORAPREP W/TINT 26ML (MISCELLANEOUS) ×2 IMPLANT
CLIP LIGATING HEM O LOK PURPLE (MISCELLANEOUS) IMPLANT
COVER SURGICAL LIGHT HANDLE (MISCELLANEOUS) ×2 IMPLANT
COVER TIP SHEARS 8 DVNC (MISCELLANEOUS) ×1 IMPLANT
COVER TIP SHEARS 8MM DA VINCI (MISCELLANEOUS) ×1
CUTTER ECHEON FLEX ENDO 45 340 (ENDOMECHANICALS) ×2 IMPLANT
DECANTER SPIKE VIAL GLASS SM (MISCELLANEOUS) IMPLANT
DRAPE ARM DVNC X/XI (DISPOSABLE) ×4 IMPLANT
DRAPE COLUMN DVNC XI (DISPOSABLE) ×1 IMPLANT
DRAPE DA VINCI XI ARM (DISPOSABLE) ×4
DRAPE DA VINCI XI COLUMN (DISPOSABLE) ×1
DRAPE LAPAROSCOPIC ABDOMINAL (DRAPES) ×2 IMPLANT
DRAPE SURG IRRIG POUCH 19X23 (DRAPES) ×2 IMPLANT
DRSG TEGADERM 4X4.75 (GAUZE/BANDAGES/DRESSINGS) ×2 IMPLANT
ELECT REM PT RETURN 9FT ADLT (ELECTROSURGICAL) ×2
ELECTRODE REM PT RTRN 9FT ADLT (ELECTROSURGICAL) ×1 IMPLANT
GLOVE BIO SURGEON STRL SZ 6.5 (GLOVE) ×2 IMPLANT
GLOVE BIOGEL M STRL SZ7.5 (GLOVE) ×4 IMPLANT
GOWN STRL REUS W/TWL LRG LVL3 (GOWN DISPOSABLE) ×8 IMPLANT
HOLDER FOLEY CATH W/STRAP (MISCELLANEOUS) ×2 IMPLANT
IV LACTATED RINGERS 1000ML (IV SOLUTION) ×2 IMPLANT
LIQUID BAND (GAUZE/BANDAGES/DRESSINGS) ×2 IMPLANT
NDL SAFETY ECLIPSE 18X1.5 (NEEDLE) ×2 IMPLANT
NEEDLE HYPO 18GX1.5 SHARP (NEEDLE) ×2
PACK ROBOT UROLOGY CUSTOM (CUSTOM PROCEDURE TRAY) ×2 IMPLANT
RELOAD GREEN ECHELON 45 (STAPLE) ×2 IMPLANT
RELOAD LINEAR CUT PROX 55 BLUE (ENDOMECHANICALS) ×6 IMPLANT
SCISSORS LAP 5X35 DISP (ENDOMECHANICALS) ×2 IMPLANT
SEAL CANN UNIV 5-8 DVNC XI (MISCELLANEOUS) ×4 IMPLANT
SEAL XI 5MM-8MM UNIVERSAL (MISCELLANEOUS) ×4
SET TUBE IRRIG SUCTION NO TIP (IRRIGATION / IRRIGATOR) ×2 IMPLANT
SOLUTION ELECTROLUBE (MISCELLANEOUS) ×2 IMPLANT
STAPLER GUN LINEAR PROX 60 (STAPLE) ×2 IMPLANT
STAPLER PROXIMATE 55 BLUE (STAPLE) ×2 IMPLANT
SUT ETHILON 3 0 PS 1 (SUTURE) ×2 IMPLANT
SUT MNCRL 3 0 RB1 (SUTURE) ×1 IMPLANT
SUT MNCRL 3 0 VIOLET RB1 (SUTURE) ×1 IMPLANT
SUT MNCRL AB 4-0 PS2 18 (SUTURE) ×4 IMPLANT
SUT MONOCRYL 3 0 RB1 (SUTURE) ×2
SUT SILK 3 0 SH CR/8 (SUTURE) ×2 IMPLANT
SUT VIC AB 0 UR5 27 (SUTURE) ×2 IMPLANT
SUT VIC AB 2-0 SH 27 (SUTURE) ×1
SUT VIC AB 2-0 SH 27X BRD (SUTURE) ×1 IMPLANT
SUT VIC AB 3-0 SH 27 (SUTURE) ×3
SUT VIC AB 3-0 SH 27XBRD (SUTURE) ×3 IMPLANT
SUT VICRYL 0 UR6 27IN ABS (SUTURE) ×4 IMPLANT
SYR 27GX1/2 1ML LL SAFETY (SYRINGE) ×2 IMPLANT
TOWEL OR NON WOVEN STRL DISP B (DISPOSABLE) ×2 IMPLANT
WATER STERILE IRR 1500ML POUR (IV SOLUTION) ×4 IMPLANT

## 2015-05-07 NOTE — Op Note (Signed)
Preoperative diagnosis: Clinically localized adenocarcinoma of the prostate (clinical stage T1c Nx Mx)  Postoperative diagnosis: Clinically localized adenocarcinoma of the prostate (clinical stage T1c Nx Mx)  Procedure:  1. Robotic assisted laparoscopic radical prostatectomy (bilateral nerve sparing) 2. Laparoscopic adhesiolysis 3. Small bowel resection and reanastamosis  Surgeon: Pryor Curia. M.D.  Assistant: Debbrah Alar, PA-C  Anesthesia: General  Complications: Small bowel enterotomy - recognized and repaired  EBL: 50 mL  IVF:  2000 mL crystalloid  Specimens: 1. Prostate and seminal vesicles  Disposition of specimens: Pathology  Drains: 1. 20 Fr coude catheter 2. # 19 Blake pelvic drain  Indication: Eric Day is a 55 y.o. year old patient with clinically localized prostate cancer. He has an extensive past surgical history with a large midline incision for a prior colectomy and a transverse periumbilical incision from when he was a child.  After a thorough review of the management options for treatment of prostate cancer, he elected to proceed with surgical therapy and the above procedure(s).  We have discussed the potential benefits and risks of the procedure, side effects of the proposed treatment, the likelihood of the patient achieving the goals of the procedure, and any potential problems that might occur during the procedure or recuperation. We specifically discussed the high risk for open surgical conversion considering his surgical history. Informed consent has been obtained.  Description of procedure:  The patient was taken to the operating room and a general anesthetic was administered. He was given preoperative antibiotics, placed in the supine position, and prepped and draped in the usual sterile fashion. Next a preoperative timeout was performed. A urethral catheter was placed into the bladder.  The patient had a prior midline laparotomy incision and a  transverse periumbilical incision. Therefore, a site was selected near above and to the left of the umbilicus, away from his prior incisions, for placement of the camera port. This was placed using a standard open Hassan technique which allowed entry into the peritoneal cavity under direct vision and without difficulty. An 8 mm port was placed and a pneumoperitoneum established. The camera was then used to inspect the abdomen.  There were no adhesions within the pelvis or toward the left side of the pelvis.  However, there were extensive adhesions noted toward the midline and the right abdominal cavity including bowel that was intimately stuck to the abdominal wall in multiple places. I place two 8 mm ports in the left abdomen and far left lateral abdominal wall.  Utilizing laparoscopic scissors, I then began the adhesiolysis laparoscopically. The bowel was carefully dissected off the abdominal wall and tediously was released.  During the adhesiolysis, there was an area of concern for possible enterotomy in the small intestine although this could not be confirmed.  The remainder of the bowel adhesions were taken down to the extent to let the remaining ports be placed.  A 5 mm port was placed in the right upper quadrant and a 12 mm port was placed in the right lateral abdominal wall for laparoscopic assistance. An additional 8 mm robotic port was also placed in the right abdomen. All ports were placed under direct vision without difficulty.  A 2-0 vicryl suture was placed through the bowel at the area of concern for possible enterotomy so it could be identified later.  It was decided to proceed with the prostatectomy at this time.  Overall, the laparoscopic adhesiolysis took about one hour and represented about 35% of the total operative time increasing  the operative time significantly over the typical operative time. The surgical cart was then docked.   Utilizing the cautery scissors, the bladder was reflected  posteriorly allowing entry into the space of Retzius and identification of the endopelvic fascia and prostate. The periprostatic fat was then removed from the prostate allowing full exposure of the endopelvic fascia. The endopelvic fascia was then incised from the apex back to the base of the prostate bilaterally and the underlying levator muscle fibers were swept laterally off the prostate thereby isolating the dorsal venous complex. The dorsal vein was then stapled and divided with a 45 mm Flex Echelon stapler. Attention then turned to the bladder neck which was divided anteriorly thereby allowing entry into the bladder and exposure of the urethral catheter. The patient had a very large prostate (measured 100 cc on ultrasound) and this resulted in a large bladder neck. The catheter balloon was deflated and the catheter was brought into the operative field and used to retract the prostate anteriorly. The posterior bladder neck was then examined and was divided allowing further dissection between the bladder and prostate posteriorly until the vasa deferentia and seminal vessels were identified. The vasa deferentia were isolated, divided, and lifted anteriorly. The seminal vesicles were dissected down to their tips with care to control the seminal vascular arterial blood supply. These structures were then lifted anteriorly and the space between Denonvillier's fascia and the anterior rectum was developed with a combination of sharp and blunt dissection. This isolated the vascular pedicles of the prostate.  The lateral prostatic fascia was then sharply incised allowing release of the neurovascular bundles bilaterally. The vascular pedicles of the prostate were then ligated with Weck clips between the prostate and neurovascular bundles and divided with sharp cold scissor dissection resulting in neurovascular bundle preservation. The neurovascular bundles were then separated off the apex of the prostate and urethra  bilaterally.  The urethra was then sharply transected allowing the prostate specimen to be disarticulated. The pelvis was copiously irrigated and hemostasis was ensured. There was no evidence for rectal injury.  The bladder neck was then reconstructed.  3-0 vicryl sutures were used to close the bladder neck at the 5 o clock and 7 o clock positions to create a small bladder neck with the ureteral orifices carefully avoided and protected.  Attention then turned to the urethral anastomosis. A 2-0 Vicryl slip knot was placed between Denonvillier's fascia, the posterior bladder neck, and the posterior urethra to reapproximate these structures. A double-armed 3-0 Monocryl suture was then used to perform a 360 running tension-free anastomosis between the bladder neck and urethra. A new urethral catheter was then placed into the bladder and irrigated. There were no blood clots within the bladder and the anastomosis appeared to be watertight. A #19 Blake drain was then brought through the left lateral 8 mm port site and positioned appropriately within the pelvis. It was secured to the skin with a nylon suture. The surgical cart was then undocked. The right lateral 12 mm port site was closed at the fascial level with a 0 Vicryl suture placed laparoscopically. All remaining ports were then removed under direct vision. The prostate specimen was removed intact within the Endopouch retrieval bag via the periumbilical camera port site.   I used the previously placed vicryl suture to help pull the small intestine out of the small incision where the prostate had been removed.  The small bowel was run and at the area of concern, there was indeed noted to  be an enterotomy.  It was decided to resect this area of bowel.  The mesentery was cleared off the small bowel on either side of the enterotomy site. A 55 mm endoGIA stapler was then used to staple and divide the bowel and the enterotomy site was removed. The edges of the  proximal and distal bowel were then sharply cut to allow the GIA to be placed into each segment of bowel and the bowel was reanastamosed at the antimesenteric border with the endoGIA.  The remaining opening was stapled with a TA-60 stapler. The mesenteric defect was then closed with a running 3-0 silk suture.  The fascial opening was then closed with two running 0 Vicryl sutures. 0.25% Marcaine was then injected into all port sites and all incisions were reapproximated at the skin level with 4-0 Monocryl subcuticular sutures and Dermabond. The patient appeared to tolerate the procedure well and without complications. The patient was able to be extubated and transferred to the recovery unit in satisfactory condition.  Pryor Curia MD

## 2015-05-07 NOTE — Interval H&P Note (Signed)
History and Physical Interval Note:  05/07/2015 9:50 AM  Eric Day  has presented today for surgery, with the diagnosis of PROSTATE CANCER  The various methods of treatment have been discussed with the patient and family. After consideration of risks, benefits and other options for treatment, the patient has consented to  Procedure(s): ROBOTIC ASSISTED LAPAROSCOPIC RADICAL PROSTATECTOMY (PROBABLE OPEN RADICAL PROSTATECTOMY), LEVEL 3 (N/A) as a surgical intervention .  The patient's history has been reviewed, patient examined, no change in status, stable for surgery.  I have reviewed the patient's chart and labs.  Questions were answered to the patient's satisfaction.     Ariana Cavenaugh,LES

## 2015-05-07 NOTE — Progress Notes (Signed)
Utilization review completed.  

## 2015-05-07 NOTE — Discharge Instructions (Signed)

## 2015-05-07 NOTE — Progress Notes (Signed)
Patient ID: Eric Day, male   DOB: Apr 08, 1961, 55 y.o.   MRN: [id number]  . Post-op note  Subjective: The patient is doing well.  No complaints.  Objective: Vital signs in last 24 hours: Temp:  [97.7 F (36.5 C)-98.6 F (37 C)] 98.6 F (37 C) (01/19 1645) Pulse Rate:  [89-105] 104 (01/19 1645) Resp:  [13-25] 17 (01/19 1645) BP: (132-153)/(84-91) 132/85 mmHg (01/19 1645) SpO2:  [88 %-99 %] 95 % (01/19 1645) Weight:  [84.823 kg (187 lb)] 84.823 kg (187 lb) (01/19 1000)  Intake/Output from previous day:   Intake/Output this shift: Total I/O In: 3950 [I.V.:2900; IV Piggyback:1050] Out: 690 [Urine:90; Drains:500; Blood:100]  Physical Exam:  General: Alert and oriented. Abdomen: Soft, Nondistended. Incisions: Clean and dry.  Lab Results:  Recent Labs  05/07/15 1541  HGB 14.7  HCT 44.9    Assessment/Plan: POD#0   1) Continue to monitor, ambulate, IS 2) NPO tonight   Pryor Curia. MD   LOS: 0 days   Omaira Mellen,LES 05/07/2015, 4:57 PM

## 2015-05-07 NOTE — Anesthesia Postprocedure Evaluation (Signed)
Anesthesia Post Note  Patient: Eric Day  Procedure(s) Performed: Procedure(s) (LRB): ROBOTIC ASSISTED LAPAROSCOPIC RADICAL PROSTATECTOMY  LEVEL 3 LYSIS OF ADHESIONS/SMALL BOWEL RESECTION AND ANASTAMOSIS (N/A)  Patient location during evaluation: PACU Anesthesia Type: General Level of consciousness: awake and alert Pain management: pain level controlled Vital Signs Assessment: post-procedure vital signs reviewed and stable Respiratory status: spontaneous breathing, nonlabored ventilation, respiratory function stable and patient connected to nasal cannula oxygen Cardiovascular status: blood pressure returned to baseline and stable Postop Assessment: no signs of nausea or vomiting Anesthetic complications: no    Last Vitals:  Filed Vitals:   05/07/15 1530 05/07/15 1545  BP: 141/91 136/87  Pulse: 99 103  Temp:    Resp: 25 17    Last Pain:  Filed Vitals:   05/07/15 1548  PainSc: Asleep                 Mong Neal JENNETTE

## 2015-05-07 NOTE — Transfer of Care (Signed)
Immediate Anesthesia Transfer of Care Note  Patient: Eric Day  Procedure(s) Performed: Procedure(s): ROBOTIC ASSISTED LAPAROSCOPIC RADICAL PROSTATECTOMY  LEVEL 3 LYSIS OF ADHESIONS/SMALL BOWEL RESECTION AND ANASTAMOSIS (N/A)  Patient Location: PACU  Anesthesia Type:General  Level of Consciousness:  sedated, patient cooperative and responds to stimulation  Airway & Oxygen Therapy:Patient Spontanous Breathing and Patient connected to face mask oxgen  Post-op Assessment:  Report given to PACU RN and Post -op Vital signs reviewed and stable  Post vital signs:  Reviewed and stable  Last Vitals:  Filed Vitals:   05/07/15 0948  BP: 134/87  Pulse: 89  Temp: 36.5 C  Resp: 16    Complications: No apparent anesthesia complications

## 2015-05-07 NOTE — Anesthesia Procedure Notes (Signed)
Procedure Name: Intubation Date/Time: 05/07/2015 11:59 AM Performed by: Freddie Breech Pre-anesthesia Checklist: Patient identified, Emergency Drugs available, Suction available, Patient being monitored and Timeout performed Patient Re-evaluated:Patient Re-evaluated prior to inductionOxygen Delivery Method: Circle system utilized Preoxygenation: Pre-oxygenation with 100% oxygen Intubation Type: IV induction Ventilation: Mask ventilation without difficulty and Oral airway inserted - appropriate to patient size Laryngoscope Size: Mac and 4 Tube type: Oral Tube size: 7.5 mm Number of attempts: 1 Airway Equipment and Method: Patient positioned with wedge pillow and Stylet Placement Confirmation: ETT inserted through vocal cords under direct vision,  positive ETCO2,  CO2 detector and breath sounds checked- equal and bilateral Secured at: 22 cm Tube secured with: Tape Dental Injury: Teeth and Oropharynx as per pre-operative assessment

## 2015-05-07 NOTE — Anesthesia Preprocedure Evaluation (Addendum)
Anesthesia Evaluation  Patient identified by MRN, date of birth, ID band Patient awake    Reviewed: Allergy & Precautions, NPO status , Patient's Chart, lab work & pertinent test results  History of Anesthesia Complications Negative for: history of anesthetic complications  Airway Mallampati: I  TM Distance: >3 FB Neck ROM: Full    Dental  (+) Edentulous Upper, Upper Dentures, Poor Dentition, Dental Advisory Given   Pulmonary Current Smoker,    breath sounds clear to auscultation       Cardiovascular + Past MI   Rhythm:Regular Rate:Normal  2006 Cardiac cath reveal normal coronary arteries. EF 65% on Echo.   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    GI/Hepatic negative GI ROS, Neg liver ROS,   Endo/Other  negative endocrine ROS  Renal/GU negative Renal ROS     Musculoskeletal  (+) Arthritis ,   Abdominal   Peds  Hematology negative hematology ROS (+)   Anesthesia Other Findings Poor oral hygiene  Reproductive/Obstetrics                            Lab Results  Component Value Date   WBC 7.8 05/04/2015   HGB 17.0 05/04/2015   HCT 51.5 05/04/2015   MCV 84.6 05/04/2015   PLT 295 05/04/2015   Lab Results  Component Value Date   CREATININE 0.86 05/04/2015   BUN 8 05/04/2015   NA 140 05/04/2015   K 4.8 05/04/2015   CL 103 05/04/2015   CO2 27 05/04/2015    Anesthesia Physical  Anesthesia Plan  ASA: II  Anesthesia Plan: General   Post-op Pain Management:    Induction: Intravenous  Airway Management Planned: Oral ETT  Additional Equipment:   Intra-op Plan:   Post-operative Plan: Extubation in OR  Informed Consent: I have reviewed the patients History and Physical, chart, labs and discussed the procedure including the risks, benefits and alternatives for the proposed anesthesia with  the patient or authorized representative who has indicated his/her understanding and acceptance.     Plan Discussed with: CRNA  Anesthesia Plan Comments:         Anesthesia Quick Evaluation

## 2015-05-08 DIAGNOSIS — C61 Malignant neoplasm of prostate: Secondary | ICD-10-CM | POA: Diagnosis not present

## 2015-05-08 LAB — HEMOGLOBIN AND HEMATOCRIT, BLOOD
HCT: 43.4 % (ref 39.0–52.0)
HEMOGLOBIN: 14.1 g/dL (ref 13.0–17.0)

## 2015-05-08 MED ORDER — TRAMADOL HCL 50 MG PO TABS: 50.0000 mg | ORAL_TABLET | Freq: Four times a day (QID) | ORAL | Status: DC | PRN

## 2015-05-08 MED ORDER — BISACODYL 10 MG RE SUPP
10.0000 mg | Freq: Once | RECTAL | Status: AC
  Administered 2015-05-08: 10 mg via RECTAL
  Filled 2015-05-08: qty 1

## 2015-05-08 MED ORDER — TRAMADOL HCL 50 MG PO TABS
100.0000 mg | ORAL_TABLET | Freq: Four times a day (QID) | ORAL | Status: DC | PRN
  Administered 2015-05-08 – 2015-05-11 (×8): 100 mg via ORAL
  Filled 2015-05-08 (×8): qty 2

## 2015-05-08 NOTE — Progress Notes (Signed)
Patient ID: Eric Day, male   DOB: 1960/08/20, 55 y.o.   MRN: [id number]  1 Day Post-Op Subjective: The patient is doing well.  No nausea or vomiting. Pain is adequately controlled. He was very drowsy after anesthesia yesterday and began ambulating this morning but did so without problems.  Objective: Vital signs in last 24 hours: Temp:  [97.7 F (36.5 C)-99 F (37.2 C)] 99 F (37.2 C) (01/20 0602) Pulse Rate:  [86-105] 86 (01/20 0602) Resp:  [13-25] 20 (01/20 0602) BP: (114-153)/(74-91) 114/76 mmHg (01/20 0602) SpO2:  [88 %-100 %] 100 % (01/20 0602) Weight:  [84.823 kg (187 lb)] 84.823 kg (187 lb) (01/19 1705)  Intake/Output from previous day: 01/19 0701 - 01/20 0700 In: 5907.5 [I.V.:4757.5; IV Piggyback:1150] Out: 1995 [Urine:1600; Drains:295; Blood:100] Intake/Output this shift:    Physical Exam:  General: Alert and oriented. CV: RRR Lungs: Clear bilaterally. GI: Soft, Nondistended. Positive BS Incisions: Clean, dry, and intact except for small amount of serosanguinous drainage from camera incision which is no longer draining. Urine: Clear Extremities: Nontender, no erythema, no edema.  Lab Results:  Recent Labs  05/07/15 1541 05/08/15 0512  HGB 14.7 14.1  HCT 44.9 43.4      Assessment/Plan: POD# 1 s/p robotic prostatectomy and bowel resection/repair  1) Decrease IVF 2) Ambulate, Incentive spirometry 3) Transition to oral pain medication (will try tramadol and attempt to avoid heavier narcotics based on past history of substance abuse) 4) Dulcolax suppository 5) Advance to clear liquids 6) Will likely plan to advance diet later today with possible d/c home tomorrow if bowel function recovering as expected   Pryor Curia. MD   LOS: 1 day   Nickayla Mcinnis,LES 05/08/2015, 7:16 AM

## 2015-05-08 NOTE — Progress Notes (Signed)
Patient ID: Eric Day, male   DOB: 1960-05-08, 55 y.o.   MRN: [id number]  Pt doing well.  No nausea or vomiting.  No flatus.  Tolerating clear liquids. Tramadol offering adequate pain control.  Abdomen with a small amount of serosanguinous drainage from camera incision with minimal drainage on gauze dressing.  Positive bowel sounds.  No distention.  Plan: - D/C drain - Continue ambulation, IS - Advance to full liquids - Dr. Risa Grill to reassess in am to determine if he can be discharged home - I have reviewed instructions with him and his brother tonight.  I confirmed that his brother will be staying with him each night and [name] will be checking on him twice daily to help him with his catheter.

## 2015-05-08 NOTE — Progress Notes (Signed)
Patient's caregiver, [name], informed RN she attended the robotic prostatectomy class and she would be managing foley care and maintenance.  Caregiver could not stay for teaching today but would like for discharging RN to go over teaching tomorrow when she picks patient up for discharge.  Caregiver informed that she would come to patient's home twice a day and someone else would be staying with patient at night.  Estill Bamberg, [place] for urology informed of caregiver's plan and only wants patient's caregiver to be taught about the foley bag and not to use the leg bag at all due to concerns of caregiver only being able to come to patient's home twice a day and not being able to empty leg bag every hour.  Patient is blind and can not see to care for foley.

## 2015-05-09 NOTE — Progress Notes (Signed)
2 Days Post-Op Subjective: Patient is now postoperative day 2 status post his robotic prostatectomy along with small bowel resection and anastomosis secondary to enterotomy. Continues to deny any flatus. He tells me he had a bowel movement) small) last night but nurses were unable to verify that. He continues to complain of moderate diffuse abdominal discomfort. No current nausea. He is remained afebrile and is not tachycardic. He is not ambulated much.     Objective: Vital signs in last 24 hours: Temp:  [98.2 F (36.8 C)-99.9 F (37.7 C)] 98.5 F (36.9 C) (01/21 0707) Pulse Rate:  [88-99] 99 (01/21 0645) Resp:  [18-20] 20 (01/21 0645) BP: (116-126)/(68-74) 126/68 mmHg (01/21 0645) SpO2:  [93 %-94 %] 93 % (01/21 0707)  Intake/Output from previous day: 01/20 0701 - 01/21 0700 In: 2130 [P.O.:480; I.V.:1650] Out: 1275 [Urine:1235; Drains:40] Intake/Output this shift:    Physical Exam:  Constitutional: Vital signs reviewed. WD WN in NAD   Eyes: PERRL, No scleral icterus.   Cardiovascular: RRR Pulmonary/Chest: Normal effort Abdominal: Soft. Mild diffuse tenderness. Incisions all appear appropriate Genitourinary: Urine light red no clots draining well from Foley catheter. Extremities: No cyanosis or edema   Lab Results:  Recent Labs  05/07/15 1541 05/08/15 0512  HGB 14.7 14.1  HCT 44.9 43.4   BMET No results for input(s): NA, K, CL, CO2, GLUCOSE, BUN, CREATININE, CALCIUM in the last 72 hours. No results for input(s): LABPT, INR in the last 72 hours. No results for input(s): LABURIN in the last 72 hours. Results for orders placed or performed during the hospital encounter of 07/04/14  Surgical pcr screen     Status: None   Collection Time: 07/04/14 11:41 AM  Result Value Ref Range Status   MRSA, PCR NEGATIVE NEGATIVE Final   Staphylococcus aureus NEGATIVE NEGATIVE Final    Comment:        The Xpert SA Assay (FDA approved for NASAL specimens in patients over 21 years of  age), is one component of a comprehensive surveillance program.  Test performance has been validated by [facility] for patients greater than or equal to 66 year old. It is not intended to diagnose infection nor to guide or monitor treatment.     Studies/Results: No results found.  Assessment/Plan:   Ongoing moderate abdominal discomfort with ileus status post surgery and bowel reanastomosed to this. I saw him early this morning and then contacted the nursing staff 2-3 hours later for an update. He continues to complain of moderate abdominal discomfort and has had no definitive bowel activity. He is taking by mouth very slowly. Would not push by mouth at this point given ongoing ileus. We'll encourage ambulation from the nursing staff. We'll plan on continued observation over the next 24 hours with hopeful discharge tomorrow if the ileus resolves. We'll go ahead and order CBC to check [place] count in the morning given his ongoing abdominal discomfort.   LOS: 2 days   Kabir Brannock S 05/09/2015, 10:59 AM

## 2015-05-09 NOTE — Progress Notes (Signed)
Patient ambulated around 60 feet total in hallway and back to his room. Tolerated fair. O2 Sat 83% RA at rest before ambulating. O2 was increased to 3L/[place] and Sats remained 90-91% with ambulation. O2 Sat 94% 2L/[place] at rest, HR 87. Continues to c/o abdominal pain. No passing gas or BM at this time. Appetite poor- states he cannot eat because "it hurts too bad." MD aware and paged to be made aware of decreased O2 Sats. Will administer pain medication when able- patient also has a pillow on abdomen to use as a splint when coughing. Encouraged deep breathing- will encourage use of IS also.

## 2015-05-10 LAB — CBC
HEMATOCRIT: 42.3 % (ref 39.0–52.0)
Hemoglobin: 14.1 g/dL (ref 13.0–17.0)
MCH: 27.6 pg (ref 26.0–34.0)
MCHC: 33.3 g/dL (ref 30.0–36.0)
MCV: 82.8 fL (ref 78.0–100.0)
Platelets: 232 10*3/uL (ref 150–400)
RBC: 5.11 MIL/uL (ref 4.22–5.81)
RDW: 13.9 % (ref 11.5–15.5)
WBC: 14.6 10*3/uL — ABNORMAL HIGH (ref 4.0–10.5)

## 2015-05-10 NOTE — Progress Notes (Signed)
3 Days Post-Op Subjective: Eric Day is now postoperative day 3 from his robotic prostatectomy along with small bowel resection and re-anastomosis secondary to an enterotomy. He continues to complain of abdominal pain but it has improved. The nurses report that he did ambulate twice yesterday but for very short distances. Both he and the nurses report positive flatus. He is taking very little by mouth intake with his clear liquid diet. No BM. He remains afebrile but does have an elevated [place] blood cell count of proximally 14,000.  Objective: Vital signs in last 24 hours: Temp:  [98.1 F (36.7 C)-98.4 F (36.9 C)] 98.1 F (36.7 C) (01/22 0414) Pulse Rate:  [83-89] 83 (01/22 0414) Resp:  [18-20] 18 (01/22 0414) BP: (129-134)/(75-81) 134/81 mmHg (01/22 0414) SpO2:  [83 %-96 %] 96 % (01/22 0414)  Intake/Output from previous day: 01/21 0701 - 01/22 0700 In: 2158.8 [P.O.:360; I.V.:1798.8] Out: 2525 [Urine:2525] Intake/Output this shift:    Physical Exam:  Constitutional: Vital signs reviewed. WD WN in NAD   Eyes: PERRL, No scleral icterus.   Cardiovascular: RRR Pulmonary/Chest: Normal effort Abdominal: Soft. There is mild diffuse tenderness. No evidence of peritoneal signs. No specific tenderness over port sites. Positive bowel sounds. Mild distention. Genitourinary: Catheter draining well. Extremities: No cyanosis or edema   Lab Results:  Recent Labs  05/07/15 1541 05/08/15 0512 05/10/15 0605  HGB 14.7 14.1 14.1  HCT 44.9 43.4 42.3   BMET No results for input(s): NA, K, CL, CO2, GLUCOSE, BUN, CREATININE, CALCIUM in the last 72 hours. No results for input(s): LABPT, INR in the last 72 hours. No results for input(s): LABURIN in the last 72 hours. Results for orders placed or performed during the hospital encounter of 07/04/14  Surgical pcr screen     Status: None   Collection Time: 07/04/14 11:41 AM  Result Value Ref Range Status   MRSA, PCR NEGATIVE NEGATIVE Final   Staphylococcus aureus NEGATIVE NEGATIVE Final    Comment:        The Xpert SA Assay (FDA approved for NASAL specimens in patients over 75 years of age), is one component of a comprehensive surveillance program.  Test performance has been validated by [facility] for patients greater than or equal to 68 year old. It is not intended to diagnose infection nor to guide or monitor treatment.     Studies/Results: No results found.  Assessment/Plan:   Resolving ileus. He continues to have some diffuse abdominal tenderness but nothing specific and no increased tenderness over ports sites specifically. He remains afebrile.  Clinically he definitely is improving. I do not believe he is ready for discharge given poor ambulation and poor by mouth intake along with ongoing abdominal discomfort and elevated [place] blood cell count. If his clinical situation improves over the next 24 hours he probably will be ready for discharge early next week.   LOS: 3 days   Eric Day S 05/10/2015, 8:54 AM

## 2015-05-11 ENCOUNTER — Inpatient Hospital Stay (HOSPITAL_COMMUNITY): Payer: Medicare Other

## 2015-05-11 LAB — CBC
HEMATOCRIT: 41.6 % (ref 39.0–52.0)
HEMOGLOBIN: 14 g/dL (ref 13.0–17.0)
MCH: 27.8 pg (ref 26.0–34.0)
MCHC: 33.7 g/dL (ref 30.0–36.0)
MCV: 82.5 fL (ref 78.0–100.0)
Platelets: 274 10*3/uL (ref 150–400)
RBC: 5.04 MIL/uL (ref 4.22–5.81)
RDW: 13.9 % (ref 11.5–15.5)
WBC: 13.8 10*3/uL — AB (ref 4.0–10.5)

## 2015-05-11 MED ORDER — BISACODYL 10 MG RE SUPP
10.0000 mg | Freq: Once | RECTAL | Status: AC
  Administered 2015-05-11: 10 mg via RECTAL
  Filled 2015-05-11: qty 1

## 2015-05-11 MED ORDER — ENSURE ENLIVE PO LIQD
237.0000 mL | Freq: Three times a day (TID) | ORAL | Status: DC
  Administered 2015-05-11 – 2015-05-12 (×3): 237 mL via ORAL

## 2015-05-11 NOTE — Progress Notes (Signed)
SATURATION QUALIFICATIONS: (This note is used to comply with regulatory documentation for home oxygen)  Patient Saturations on Room Air at Rest = 91%  Patient Saturations on Room Air while Ambulating = 78%  Patient Saturations on 8 Liters of oxygen while Ambulating = 92%  Please briefly explain why patient needs home oxygen: Pt had significant drop in O2 while walking

## 2015-05-11 NOTE — Progress Notes (Signed)
Patient ID: Eric Day, male   DOB: [birth year], 55 y.o.   MRN: [id number]  4 Days Post-Op Subjective: Events from weekend noted and reviewed. Eric Day still has had poor po intake but denies nausea or vomiting.  His abdominal pain is improved.  He is passing flatus. Required increased supplemental oxygen over the weekend with no subjective symptoms noted.  It is unclear if there has been an attempt to wean oxygen since Saturday.  Oxygen saturation is 100% currently. Denies SOB, cough, or other pulmonary symptoms.  Objective: Vital signs in last 24 hours: Temp:  [97.8 F (36.6 C)-98.5 F (36.9 C)] 97.8 F (36.6 C) (01/23 0557) Pulse Rate:  [68-88] 68 (01/23 0557) Resp:  [18-20] 20 (01/23 0557) BP: (120-137)/(76-88) 120/79 mmHg (01/23 0557) SpO2:  [97 %-100 %] 100 % (01/23 0557)  Intake/Output from previous day: 01/22 0701 - 01/23 0700 In: 1886.3 [P.O.:310; I.V.:1576.3] Out: 2050 [Urine:2050] Intake/Output this shift: Total I/O In: 700 [P.O.:100; I.V.:600] Out: 950 [Urine:950]  Physical Exam:  General: Alert and oriented CV: RRR Lungs: Clear Abdomen: Soft, ND, Positive BS,  Incisions: C/D/I, dressing from midline removed Ext: NT, No erythema  Lab Results:  Recent Labs  05/10/15 0605 05/11/15 0355  HGB 14.1 14.0  HCT 42.3 41.6   CBC Latest Ref Rng 05/11/2015 05/10/2015 05/08/2015  WBC 4.0 - 10.5 K/uL 13.8(H) 14.6(H) -  Hemoglobin 13.0 - 17.0 g/dL 14.0 14.1 14.1  Hematocrit 39.0 - 52.0 % 41.6 42.3 43.4  Platelets 150 - 400 K/uL 274 232 -    Studies/Results: No results found.  Assessment/Plan: POD # 4 s/p robotic prostatectomy and small bowel resection and re-anastamosis - Resolving ileus.  Will SL IVF.  I encouraged increased po intake this morning. - Wean oxygen - Will re-evaluate later this morning/afternoon for discharge    LOS: 4 days   Eric Day,LES 05/11/2015, 6:56 AM

## 2015-05-11 NOTE — Care Management Important Message (Signed)
Important Message  Patient Details  Name: Eric Day MRN: [id number] Date of Birth: 1961/03/25   Medicare Important Message Given:  Yes    Camillo Flaming 05/11/2015, 1:27 [place] Message  Patient Details  Name: Eric Day MRN: [id number] Date of Birth: 10-Dec-1960   Medicare Important Message Given:  Yes    Camillo Flaming 05/11/2015, 1:27 PM

## 2015-05-11 NOTE — Progress Notes (Signed)
Assessed patient with co oxygen while at rest. Patient O2 was at 89-90%. 1L of oxygen applied. Per order, oxygen levels to be above 92%. With 1L, pt oxygen saturation was at 96%.

## 2015-05-11 NOTE — Plan of Care (Signed)
Problem: Bowel/Gastric: Goal: Will not experience complications related to bowel motility Outcome: Progressing Patient has Flatus

## 2015-05-11 NOTE — Progress Notes (Signed)
Patient ID: Eric Day, male   DOB: Feb 23, 1961, 55 y.o.   MRN: [id number]  4 Days Post-Op Subjective: Pt still without SOB or subjective difficulty breathing.  I personally removed his oxygen (he was on 1 L) and his oxygen saturations were 95-96% on RA after 5 minutes.  He has been passing flatus but still has poor po intake.  Objective: Vital signs in last 24 hours: Temp:  [97.8 F (36.6 C)-99.5 F (37.5 C)] 99.5 F (37.5 C) (01/23 1357) Pulse Rate:  [68-90] 90 (01/23 1357) Resp:  [18-20] 20 (01/23 1357) BP: (113-137)/(75-79) 113/75 mmHg (01/23 1357) SpO2:  [94 %-100 %] 94 % (01/23 1357)  Intake/Output from previous day: 01/22 0701 - 01/23 0700 In: 1886.3 [P.O.:310; I.V.:1576.3] Out: 2050 [Urine:2050] Intake/Output this shift:    Physical Exam:  General: Alert and oriented Abdomen: Soft, Mild distention Incisions: C/D/I Ext: NT, No erythema  Lab Results:  Assessment/Plan: Oxygen saturation improved and ok on RA.  I discontinued his supplemental oxygen. Have reinforced the need for him to increase his po intake and will provide supplemental nutritional shakes for him.  If improved po intake overnight, will d/c home in the morning.   LOS: 4 days   Eric Day,LES 05/11/2015, 3:50 PM

## 2015-05-12 NOTE — Progress Notes (Signed)
Cmpleted D/C teaching with patient and brother. Demonstrated foley care. Pt and brother verbalized understanding. Pt will be D/C home with brother. He is in stable condition.

## 2015-05-12 NOTE — Progress Notes (Signed)
SATURATION QUALIFICATIONS: (This note is used to comply with regulatory documentation for home oxygen)  Patient Saturations on Room Air at Rest = 96%  Patient Saturations on Room Air while Ambulating = 89%-93%  Please briefly explain why patient needs home oxygen: Pt has improved and does not need home O2

## 2015-05-12 NOTE — Progress Notes (Signed)
Patient ID: Eric Day, male   DOB: 1960-08-13, 55 y.o.   MRN: [id number]  I spoke with Mr. Mendillo caretaker, [name], who confirmed that he does smoke 1/2 PPD.  Therefore, I am willing to tolerate oxygen sats 90% and up.  Will amend the nursing order.

## 2015-05-12 NOTE — Discharge Summary (Signed)
  Date of admission: 05/07/2015  Date of discharge: 05/12/2015  Admission diagnosis: Prostate Cancer  Discharge diagnosis: Prostate Cancer,  Secondary diagnoses: Hypertension, colon cancer s/p colectomy, tobacco use  History and Physical: For full details, please see admission history and physical. Briefly, Eric Day is a 55 y.o. gentleman with localized prostate cancer.  After discussing management/treatment options, he elected to proceed with surgical treatment.  Hospital Course: Eric Day was taken to the operating room on 05/07/2015 and underwent a robotic assisted laparoscopic radical prostatectomy. He did have an extensive prior surgical history including a midline laparotomy incision from a prior colectomy as well as a transverse abdominal incision for an unknown procedure as a child.  Intraoperatively, he was noted to have significant intra-abdominal adhesions requiring an extensive laparoscopic adhesiolysis. He did have an identified small enterotomy which was recognized and treated with small bowel resection and reanastomosis. Postoperatively, he was able to be transferred to a regular hospital room following recovery from anesthesia.  He was able to begin ambulating the night of surgery. He remained hemodynamically stable overnight.  He had excellent urine output with appropriately minimal output from his pelvic drain and his pelvic drain was removed on POD #1. Due to his small bowel anastomosis, his diet was gradually advanced beginning with clear liquids on postoperative day 1.  He tolerated this well and was advanced to full liquids that evening. He experienced some abdominal pain and distention possibly consistent with a mild ileus was also noted to have some issues related to hypoxia requiring continued oxygen thought to be secondary to atelectasis. By postoperative day 5, he was passing flatus and tolerating his diet.  He was able to ambulate without difficulty and was managed on  oral pain medication.  He underwent a chest x-ray which confirmed low lung volumes and atelectasis.  With incentive spirometry and continued walking, his oxygen saturations improved and he was able to be discharged home finally on postoperative day 5.   Laboratory values:  Recent Labs  05/10/15 0605 05/11/15 0355  HGB 14.1 14.0  HCT 42.3 41.6    Disposition: Home  Discharge instruction: He was instructed to be ambulatory but to refrain from heavy lifting, strenuous activity, or driving. He was instructed on urethral catheter care. He was instructed to call should he develop subjective shortness of breath or fever.  Discharge medications:     Medication List    STOP taking these medications        tamsulosin 0.4 MG Caps capsule  Commonly known as:  FLOMAX      TAKE these medications        sulfamethoxazole-trimethoprim 800-160 MG tablet  Commonly known as:  BACTRIM DS,SEPTRA DS  Take 1 tablet by mouth 2 (two) times daily. Start the day prior to foley removal appointment     traMADol 50 MG tablet  Commonly known as:  ULTRAM  Take 1 tablet (50 mg total) by mouth every 6 (six) hours as needed for moderate pain (May take two tablets every six hours if needed for pain).        Followup: He will followup in 1 week for catheter removal and to discuss his surgical pathology results.

## 2015-05-12 NOTE — Progress Notes (Signed)
Patient ID: Eric Day, male   DOB: 02-01-1961, 55 y.o.   MRN: [id number]  5 Days Post-Op Subjective: Pt subjectively doing well.  Continues to pass flatus.  Had a small liquid BM yesterday.  Tolerated Ensure shake last night although appetite is still somewhat poor.  He has been ambulating well.  I had taken him of oxygen yesterday with sats 95-96% on RA.  However, he was placed back on 1 L last night and is currently on 4 L right now. Still denies cough, SOB, etc.  Objective: Vital signs in last 24 hours: Temp:  [97.4 F (36.3 C)-99.5 F (37.5 C)] 97.4 F (36.3 C) (01/24 0652) Pulse Rate:  [82-90] 82 (01/24 0652) Resp:  [18-20] 18 (01/24 0652) BP: (113-134)/(75-85) 134/85 mmHg (01/24 0652) SpO2:  [94 %-98 %] 97 % (01/24 0652)  Intake/Output from previous day: 01/23 0701 - 01/24 0700 In: -  Out: 650 [Urine:650] Intake/Output this shift:    Physical Exam:  General: Alert and oriented CV: RRR Lungs: Clear Abdomen: Mild distention, positive bowel sounds, NT Incisions:  C/D/I Ext: NT, No erythema  Lab Results:  Recent Labs  05/10/15 0605 05/11/15 0355  HGB 14.1 14.0  HCT 42.3 41.6   BMET   Studies/Results: Dg Chest 2 View  05/11/2015  CLINICAL DATA:  Shortness of breath. EXAM: CHEST  2 VIEW COMPARISON:  05/04/2015 FINDINGS: Cardiomediastinal silhouette is normal. Mediastinal contours appear intact. There is no evidence of pleural effusion or pneumothorax. There are low lung volumes with bibasilar streaky Peribronchovascular airspace opacities. Osseous structures are without acute abnormality. Soft tissues are grossly normal. IMPRESSION: Low lung volumes with bibasilar streaky airspace opacities which may represent developing bilateral pneumonia or atelectasis. Electronically Signed   By: Fidela Salisbury M.D.   On: 05/11/2015 19:26    Assessment/Plan: POD # 5 s/p robotic prostatectomy and small bowel resection and reanastomosis 1) Hypoxemia - This is likely  secondary to atelectasis based on CXR and clinical picture. Encouraged IS use and deep breathing exercises.  I spoke with his nurse, Catie, this morning to aggressive attempt to wean him off oxygen.  If he is unable to be weaned, will ask pulmonary to see him regarding recommendations as he was not on oxygen preoperatively and I do not think he can manage home oxygen considering his social situation. 2) GI: Encouraged increased po intake and nutritional supplements. 3) Prostate cancer: Pathology pending.  Continue catheter. 4) Disposition: Aside from his pulmonary concern, he appears ready for discharge.  Will re-evaluate later this morning with plans for discharge vs pulmonary consultation.   LOS: 5 days   Casara Perrier,LES 05/12/2015, 7:48 AM

## 2015-05-12 NOTE — Progress Notes (Signed)
Patient ID: Eric Day, male   DOB: [birth year], 55 y.o.   MRN: [id number]  I re-evaluated Eric Day this afternoon.  He was able to be weaned off oxygen with room air sats of 95%.  I rechecked him when I evaluated him at 12:30 pm today and he maintained 90-92% on room air.   Considering his smoking history, I do not see reason for additional evaluation at this time.  Atelectasis is the most likely contributing factor to his oxygen saturation readings aside from his chronic lung disease related to smoking.  He has been instructed to continue incentive spirometry at home.

## 2015-11-28 ENCOUNTER — Encounter (HOSPITAL_COMMUNITY): Payer: Self-pay | Admitting: *Deleted

## 2015-11-28 DIAGNOSIS — Z8546 Personal history of malignant neoplasm of prostate: Secondary | ICD-10-CM | POA: Diagnosis not present

## 2015-11-28 DIAGNOSIS — K5900 Constipation, unspecified: Secondary | ICD-10-CM | POA: Diagnosis present

## 2015-11-28 DIAGNOSIS — I252 Old myocardial infarction: Secondary | ICD-10-CM | POA: Diagnosis not present

## 2015-11-28 DIAGNOSIS — F17210 Nicotine dependence, cigarettes, uncomplicated: Secondary | ICD-10-CM | POA: Insufficient documentation

## 2015-11-28 DIAGNOSIS — Z85038 Personal history of other malignant neoplasm of large intestine: Secondary | ICD-10-CM | POA: Insufficient documentation

## 2015-11-28 NOTE — ED Triage Notes (Signed)
Pt c/o constipation for a week. Pt states last BM was last week. Pt has been taking stool softeners and laxatives without relief. Pt is not on any pain medications at this time. Pt states he is able to pass gas.

## 2015-11-29 ENCOUNTER — Emergency Department (HOSPITAL_COMMUNITY): Payer: Medicare Other

## 2015-11-29 ENCOUNTER — Emergency Department (HOSPITAL_COMMUNITY)
Admission: EM | Admit: 2015-11-29 | Discharge: 2015-11-29 | Disposition: A | Discharge status: Home / Self Care | Payer: Medicare Other | Attending: Emergency Medicine | Admitting: Emergency Medicine

## 2015-11-29 DIAGNOSIS — K5900 Constipation, unspecified: Secondary | ICD-10-CM

## 2015-11-29 MED ORDER — MILK AND MOLASSES ENEMA
1.0000 | Freq: Once | RECTAL | Status: AC
  Administered 2015-11-29: 250 mL via RECTAL
  Filled 2015-11-29: qty 250

## 2015-11-29 MED ORDER — DICYCLOMINE HCL 10 MG/ML IM SOLN
20.0000 mg | Freq: Once | INTRAMUSCULAR | Status: AC
  Administered 2015-11-29: 20 mg via INTRAMUSCULAR
  Filled 2015-11-29: qty 2

## 2015-11-29 MED ORDER — POLYETHYLENE GLYCOL 3350 17 GM/SCOOP PO POWD: 17.0000 g | Freq: Two times a day (BID) | ORAL | 1 refills | Status: DC

## 2015-11-29 NOTE — ED Notes (Signed)
Pt's sister , Ennis Forts is going to come get patient. He will wait in lobby. Pt okay with plan, along with sister.

## 2015-11-29 NOTE — ED Provider Notes (Signed)
This patient's care was assumed from [facility], PA-C at shift change. Please see her note for further. Briefly, the patient presented with constipation. X-ray was obtained which showed evidence consistent with constipation and no evidence of small bowel obstruction. Patient received an enema and is in the process of having a bowel movement at shift change. Plan is for discharge after the patient attempts to have a bowel movement. At my evaluation the patient has had a very large bowel movement. He tells me he is feeling much better. He feels ready for discharge. We'll discharge with prescription for MiraLAX and education for high fiber diet. I encouraged him to follow-up closely with his primary care provider. I advised the patient to follow-up with their primary care provider this week. I advised the patient to return to the emergency department with new or worsening symptoms or new concerns. The patient verbalized understanding and agreement with plan.     Dg Abd 2 Views  Result Date: 11/29/2015 CLINICAL DATA:  Constipation for the past week. EXAM: ABDOMEN - 2 VIEW COMPARISON:  Relevant previous examinations. FINDINGS: Normal bowel gas pattern without free peritoneal air. Prominent stool in the rectosigmoid colon. Right lower quadrant surgical staples. Mild lumbar spine degenerative changes. Chronic L3 vertebral compression deformity and prominent trabeculae. IMPRESSION: 1. Prominent stool in the rectosigmoid colon. 2. No acute abnormality. 3. 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 Electronically Signed   By: Claudie Revering M.D.   On: 11/29/2015 03:03     Vitals:   11/28/15 2152 11/29/15 0115 11/29/15 0116 11/29/15 0119  BP: 115/89 116/82    Pulse: 109  80   Resp: 16     Temp: 97.5 F (36.4 C)     TempSrc: Oral     SpO2: 98%  97% 100%  Weight: 76.8 kg     Height: 5\' 6"  (1.676 m)       Constipation, unspecified  constipation type  Constipation - Plan: DG Abd 2 Views, DG Abd 2 Views      Waynetta Pean, [place] 11/29/15 0745    Everlene Balls, MD 11/29/15 1725

## 2015-11-29 NOTE — ED Provider Notes (Signed)
[facility] DEPT Provider Note   CSN: [id number] Arrival date & time: 11/28/15  2144  First Provider Contact:  None      History   Chief Complaint Chief Complaint  Patient presents with  . Constipation    HPI Eric Day is a 55 y.o. male.  Patient is a 55 year old male with a history of myocardial infarction, neurofibromatosis, prostate CA s/p prostatectomy and stomach cancer presents to the ED for evaluation of constipation. Patient states that he has not been able to have a bowel movement for the past week. He has an aching discomfort in his lower abdomen. He states that he has been able to pass flatus daily. He has had some nausea at times, but no emesis. Patient has tried taking stool softeners and laxatives without relief. He denies taking any narcotic pain medication regularly. He denies any associated fever, hematuria or dysuria. He does have a history of multiple abdominal surgeries associated with a hernia repair and his stomach cancer. Patient is a fairly poor historian.     Past Medical History:  Diagnosis Date  . Arthritis   . Colon cancer [facility])    prostate; history of stomach cancer   . Depression   . Eye disorder    rt artificial  . Hard of hearing    bilat   . Legally blind   . Multiple falls   . Myocardial infarction ([place])    2008  . Neurofibromatosis ([place])   . Nocturia   . Prosthetic eye globe    right eye   . Shortness of breath dyspnea    walking distances or running   . Tuberculosis    70's  . Wears glasses     Patient Active Problem List   Diagnosis Date Noted  . Prostate cancer ([place]) 05/07/2015  . 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 ABDOMINAL SURGERY  90's   stomach?  . ANTERIOR CERVICAL DECOMP/DISCECTOMY FUSION N/A 07/07/2014   Procedure: ANTERIOR CERVICAL DECOMPRESSION/DISCECTOMY FUSION CERVICAL THREE-FOUR;  Surgeon: Kary Kos, MD;   Location: [place] [facility];  Service: Neurosurgery;  Laterality: N/A;  . CARDIAC CATHETERIZATION    . EYE SURGERY  90's  . HERNIA REPAIR     hernia as child  . ROBOT ASSISTED LAPAROSCOPIC RADICAL PROSTATECTOMY N/A 05/07/2015   Procedure: ROBOTIC ASSISTED LAPAROSCOPIC RADICAL PROSTATECTOMY  LEVEL 3 LYSIS OF ADHESIONS/SMALL BOWEL RESECTION AND ANASTAMOSIS;  Surgeon: Raynelle Bring, MD;  Location: [facility];  Service: Urology;  Laterality: N/A;       Home Medications    Prior to Admission medications   Medication Sig Start Date End Date Taking? Authorizing Provider  sulfamethoxazole-trimethoprim (BACTRIM DS,SEPTRA DS) 800-160 MG tablet Take 1 tablet by mouth 2 (two) times daily. Start the day prior to foley removal appointment Patient not taking: Reported on 11/29/2015 05/07/15   Debbrah Alar, PA-C  traMADol (ULTRAM) 50 MG tablet Take 1 tablet (50 mg total) by mouth every 6 (six) hours as needed for moderate pain (May take two tablets every six hours if needed for pain). Patient not taking: Reported on 11/29/2015 05/08/15   Debbrah Alar, PA-C    Family History History reviewed. No pertinent family history.  Social History Social History  Substance Use Topics  . Smoking status: Current Every Day Smoker    Packs/day: 0.50    Years: 40.00    Types: Cigarettes  . Smokeless tobacco: Never Used  .  Alcohol use No     Comment: quit 16 yrs ago     Allergies   Review of patient's allergies indicates no known allergies.   Review of Systems Review of Systems  Constitutional: Negative for fever.  Gastrointestinal: Positive for abdominal pain, constipation and nausea. Negative for blood in stool, diarrhea and vomiting.  Genitourinary: Negative for dysuria.  Ten systems reviewed and are negative for acute change, except as noted in the HPI.    Physical Exam Updated Vital Signs BP 116/82   Pulse 80   Temp 97.5 F (36.4 C) (Oral)   Resp 16   Ht 5\' 6"  (1.676 m)   Wt 76.8 kg   SpO2 100%    BMI 27.33 kg/m   Physical Exam  Constitutional: He is oriented to person, place, and time. He appears well-developed and well-nourished. No distress.  Nontoxic/nonseptic appearing. Pleasant.  HENT:  Head: Normocephalic and atraumatic.  Eyes: Conjunctivae and EOM are normal. No scleral icterus.  Neck: Normal range of motion.  Cardiovascular: Normal rate, regular rhythm and intact distal pulses.   Pulmonary/Chest: Effort normal. No respiratory distress. He has no wheezes.  Respirations even and unlabored  Abdominal: Soft. Bowel sounds are normal. He exhibits no distension and no mass. There is tenderness. There is no guarding.  Very mild generalized tenderness. No significant abdominal distention. No guarding or peritoneal signs. Bowel sounds normoactive.  Musculoskeletal: Normal range of motion.  Neurological: He is alert and oriented to person, place, and time.  GCS 15. Patient moving all extremities.  Skin: Skin is warm and dry. No rash noted. He is not diaphoretic. No erythema. No pallor.  Psychiatric: He has a normal mood and affect. His behavior is normal.  Nursing note and vitals reviewed.    ED Treatments / Results  Labs (all labs ordered are listed, but only abnormal results are displayed) Labs Reviewed - No data to display  EKG  EKG Interpretation None       Radiology Dg Abd 2 Views  Result Date: 11/29/2015 CLINICAL DATA:  Constipation for the past week. EXAM: ABDOMEN - 2 VIEW COMPARISON:  Relevant previous examinations. FINDINGS: Normal bowel gas pattern without free peritoneal air. Prominent stool in the rectosigmoid colon. Right lower quadrant surgical staples. Mild lumbar spine degenerative changes. Chronic L3 vertebral compression deformity and prominent trabeculae. IMPRESSION: 1. Prominent stool in the rectosigmoid colon. 2. No acute abnormality. 3. 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  Electronically Signed   By: Claudie Revering M.D.   On: 11/29/2015 03:03    Procedures Procedures (including critical care time)  Medications Ordered in ED Medications  milk and molasses enema (250 mLs Rectal Given 11/29/15 0444)     Initial Impression / Assessment and Plan / ED Course  I have reviewed the triage vital signs and the nursing notes.  Pertinent labs & imaging results that were available during my care of the patient were reviewed by me and considered in my medical decision making (see chart for details).  Clinical Course    0330 - Xray reviewed; c/w constipation. No evidence of SBO. Low suspicion for this given lack of emesis and consistent passing of flatus. Will attempt enema.  0530 - Patient with difficulty having BM after M&M enema. DRE performed; chaperoned by nurse. There is no distinct stool ball in the rectum, no fecal impaction. Rectal tone normal.   0600 - Patient has had one large stool, but continues  to experience cramping. Bentyl ordered. He is attempting to have another BM.  6:14 AM Patient case discussed with Will Dansie,PA-C who will assume care. Anticipate d/c with Miralax when done attempting stooling.   Final Clinical Impressions(s) / ED Diagnoses   Final diagnoses:  Constipation    New Prescriptions New Prescriptions   No medications on file     Antonietta Breach, PA-C 11/29/15 [id number]    Everlene Balls, MD 11/29/15 1725

## 2017-06-27 ENCOUNTER — Ambulatory Visit (INDEPENDENT_AMBULATORY_CARE_PROVIDER_SITE_OTHER): Payer: Medicare Other

## 2017-06-27 ENCOUNTER — Encounter: Payer: Self-pay | Admitting: Podiatry

## 2017-06-27 ENCOUNTER — Ambulatory Visit (INDEPENDENT_AMBULATORY_CARE_PROVIDER_SITE_OTHER): Payer: Medicare Other | Admitting: Podiatry

## 2017-06-27 VITALS — BP 134/97 | HR 96 | Resp 16

## 2017-06-27 DIAGNOSIS — M898X9 Other specified disorders of bone, unspecified site: Secondary | ICD-10-CM

## 2017-06-27 DIAGNOSIS — M79671 Pain in right foot: Secondary | ICD-10-CM

## 2017-06-27 DIAGNOSIS — M79672 Pain in left foot: Secondary | ICD-10-CM | POA: Diagnosis not present

## 2017-06-27 NOTE — Progress Notes (Signed)
Subjective:    Patient ID: Eric Day, male    DOB: 06-01-1960, 57 y.o.   MRN: [id number]  HPI 57 year old male presents the office today with a caregiver for concerns of bumps on the top of both of his feet which are painful at times with shoes.  He states it feels like there is a knot on the top of his feet and this is been ongoing for about 2 months.  He denies any recent injury or trauma he denies any recent treatment.  He has no other concerns today.  Review of Systems  All other systems reviewed and are negative.  Past Medical History:  Diagnosis Date  . Arthritis   . Colon cancer [facility])    prostate; history of stomach cancer   . Depression   . Eye disorder    rt artificial  . Hard of hearing    bilat   . Legally blind   . Multiple falls   . Myocardial infarction ([place])    2008  . Neurofibromatosis ([place])   . Nocturia   . Prosthetic eye globe    right eye   . Shortness of breath dyspnea    walking distances or running   . Tuberculosis    70's  . Wears glasses     Past Surgical History:  Procedure Laterality Date  . ABDOMINAL SURGERY  90's   stomach?  . ANTERIOR CERVICAL DECOMP/DISCECTOMY FUSION N/A 07/07/2014   Procedure: ANTERIOR CERVICAL DECOMPRESSION/DISCECTOMY FUSION CERVICAL THREE-FOUR;  Surgeon: Kary Kos, MD;  Location: [place] [facility];  Service: Neurosurgery;  Laterality: N/A;  . CARDIAC CATHETERIZATION    . EYE SURGERY  90's  . HERNIA REPAIR     hernia as child  . ROBOT ASSISTED LAPAROSCOPIC RADICAL PROSTATECTOMY N/A 05/07/2015   Procedure: ROBOTIC ASSISTED LAPAROSCOPIC RADICAL PROSTATECTOMY  LEVEL 3 LYSIS OF ADHESIONS/SMALL BOWEL RESECTION AND ANASTAMOSIS;  Surgeon: Raynelle Bring, MD;  Location: [facility];  Service: Urology;  Laterality: N/A;     Current Outpatient Medications:  .  polyethylene glycol powder (GLYCOLAX/MIRALAX) powder, Take 17 g by mouth 2 (two) times daily., Disp: 255 g, Rfl: 1 .  sulfamethoxazole-trimethoprim (BACTRIM DS,SEPTRA DS)  800-160 MG tablet, Take 1 tablet by mouth 2 (two) times daily. Start the day prior to foley removal appointment, Disp: 6 tablet, Rfl: 0 .  traMADol (ULTRAM) 50 MG tablet, Take 1 tablet (50 mg total) by mouth every 6 (six) hours as needed for moderate pain (May take two tablets every six hours if needed for pain)., Disp: 30 tablet, Rfl: 0  No Known Allergies  Social History   Socioeconomic History  . Marital status: Married    Spouse name: Not on file  . Number of children: Not on file  . Years of education: Not on file  . Highest education level: Not on file  Social Needs  . Financial resource strain: Not on file  . Food insecurity - worry: Not on file  . Food insecurity - inability: Not on file  . Transportation needs - medical: Not on file  . Transportation needs - non-medical: Not on file  Occupational History  . Not on file  Tobacco Use  . Smoking status: Current Every Day Smoker    Packs/day: 0.50    Years: 40.00    Pack years: 20.00    Types: Cigarettes  . Smokeless tobacco: Never Used  Substance and Sexual Activity  . Alcohol use: No    Comment: quit 16 yrs ago  .  Drug use: Yes    Types: "Crack" cocaine, LSD    Comment: quit 16 yrs ago  . Sexual activity: Not on file  Other Topics Concern  . Not on file  Social History Narrative  . Not on file        Objective:   Physical Exam General: AAO x3, NAD-patient is blind  Dermatological: Skin is warm, dry and supple bilateral. Nails x 10 are well manicured; remaining integument appears unremarkable at this time. There are no open sores, no preulcerative lesions, no rash or signs of infection present.  Vascular: Dorsalis Pedis artery and Posterior Tibial artery pedal pulses are 2/4 bilateral with immedate capillary fill time.  There is no pain with calf compression, swelling, warmth, erythema.   Neruologic: Grossly intact via light touch bilateral. Protective threshold with Semmes Wienstein monofilament intact to all  pedal sites bilateral.   Musculoskeletal: On the dorsal aspect of bilateral midfoot joint along the first metatarsal cuneiform joint areas of bony prominence palpable.  No significant discomfort to the area today but this is where he gets discomfort inside shoes.  There is no overlying erythema, edema.  No open lesions.  There is no specific area pinpoint bony tenderness or pain to vibratory sensation.  Muscular strength 5/5 in all groups tested bilateral.  Gait: Unassisted, Nonantalgic.      Assessment & Plan:  57 year old male with dorsal exostosis bilaterally -Treatment options discussed including all alternatives, risks, and complications -Etiology of symptoms were discussed -X-rays were obtained and reviewed with the patient.  No evidence of acute fracture or stress fracture.  I showed the caregiver the x-rays today and there is a dorsal prominence present of the Lisfranc joint. -I relates to shoes in order to take pressure off the dorsal prominence.  He states that on the way on his feet were feeling much better and relates in the shoes.  We also discussed offloading to the area.  Trula Slade DPM

## 2017-10-10 DIAGNOSIS — H47522 Disorders of visual pathways in (due to) neoplasm, left side: Secondary | ICD-10-CM | POA: Diagnosis not present

## 2017-10-10 DIAGNOSIS — Q8500 Neurofibromatosis, unspecified: Secondary | ICD-10-CM | POA: Diagnosis not present

## 2017-10-10 DIAGNOSIS — H40002 Preglaucoma, unspecified, left eye: Secondary | ICD-10-CM | POA: Diagnosis not present

## 2018-05-08 ENCOUNTER — Encounter: Payer: Self-pay | Admitting: *Deleted

## 2018-05-09 ENCOUNTER — Ambulatory Visit: Payer: Medicare Other | Admitting: Neurology

## 2018-05-09 ENCOUNTER — Telehealth: Payer: Self-pay | Admitting: *Deleted

## 2018-05-09 NOTE — Telephone Encounter (Signed)
No showed new patient appointment. 

## 2018-05-10 ENCOUNTER — Encounter: Payer: Self-pay | Admitting: Neurology

## 2018-05-26 ENCOUNTER — Emergency Department (HOSPITAL_COMMUNITY)
Admission: EM | Admit: 2018-05-26 | Discharge: 2018-05-26 | Disposition: A | Discharge status: Home / Self Care | Payer: Medicare Other | Attending: Emergency Medicine | Admitting: Emergency Medicine

## 2018-05-26 ENCOUNTER — Encounter (HOSPITAL_COMMUNITY): Payer: Self-pay

## 2018-05-26 ENCOUNTER — Other Ambulatory Visit: Payer: Self-pay

## 2018-05-26 ENCOUNTER — Emergency Department (HOSPITAL_COMMUNITY): Payer: Medicare Other

## 2018-05-26 DIAGNOSIS — I10 Essential (primary) hypertension: Secondary | ICD-10-CM | POA: Diagnosis not present

## 2018-05-26 DIAGNOSIS — Z79899 Other long term (current) drug therapy: Secondary | ICD-10-CM | POA: Insufficient documentation

## 2018-05-26 DIAGNOSIS — R935 Abnormal findings on diagnostic imaging of other abdominal regions, including retroperitoneum: Secondary | ICD-10-CM | POA: Diagnosis not present

## 2018-05-26 DIAGNOSIS — I252 Old myocardial infarction: Secondary | ICD-10-CM | POA: Diagnosis not present

## 2018-05-26 DIAGNOSIS — Z85038 Personal history of other malignant neoplasm of large intestine: Secondary | ICD-10-CM | POA: Insufficient documentation

## 2018-05-26 DIAGNOSIS — Z85028 Personal history of other malignant neoplasm of stomach: Secondary | ICD-10-CM | POA: Insufficient documentation

## 2018-05-26 DIAGNOSIS — F17210 Nicotine dependence, cigarettes, uncomplicated: Secondary | ICD-10-CM | POA: Diagnosis not present

## 2018-05-26 DIAGNOSIS — N390 Urinary tract infection, site not specified: Secondary | ICD-10-CM | POA: Diagnosis not present

## 2018-05-26 DIAGNOSIS — R1110 Vomiting, unspecified: Secondary | ICD-10-CM | POA: Diagnosis present

## 2018-05-26 LAB — URINALYSIS, ROUTINE W REFLEX MICROSCOPIC
BILIRUBIN URINE: NEGATIVE
Glucose, UA: NEGATIVE mg/dL
Ketones, ur: NEGATIVE mg/dL
Nitrite: POSITIVE — AB
PH: 7 (ref 5.0–8.0)
Protein, ur: NEGATIVE mg/dL
SPECIFIC GRAVITY, URINE: 1.013 (ref 1.005–1.030)
WBC, UA: >50 WBC/hpf — ABNORMAL HIGH (ref 0–5)

## 2018-05-26 LAB — CBC
HEMATOCRIT: 46.3 % (ref 39.0–52.0)
Hemoglobin: 14.2 g/dL (ref 13.0–17.0)
MCH: 26.5 pg (ref 26.0–34.0)
MCHC: 30.7 g/dL (ref 30.0–36.0)
MCV: 86.5 fL (ref 80.0–100.0)
PLATELETS: 314 10*3/uL (ref 150–400)
RBC: 5.35 MIL/uL (ref 4.22–5.81)
RDW: 14.1 % (ref 11.5–15.5)
WBC: 9.1 10*3/uL (ref 4.0–10.5)
nRBC: 0 % (ref 0.0–0.2)

## 2018-05-26 LAB — COMPREHENSIVE METABOLIC PANEL
ALK PHOS: 75 U/L (ref 38–126)
ALT: 22 U/L (ref 0–44)
AST: 14 U/L — AB (ref 15–41)
Albumin: 3.9 g/dL (ref 3.5–5.0)
Anion gap: 7 (ref 5–15)
BUN: 7 mg/dL (ref 6–20)
CALCIUM: 9.5 mg/dL (ref 8.9–10.3)
CHLORIDE: 105 mmol/L (ref 98–111)
CO2: 27 mmol/L (ref 22–32)
CREATININE: 0.85 mg/dL (ref 0.61–1.24)
Glucose, Bld: 103 mg/dL — ABNORMAL HIGH (ref 70–99)
Potassium: 4.3 mmol/L (ref 3.5–5.1)
Sodium: 139 mmol/L (ref 135–145)
Total Bilirubin: 1.1 mg/dL (ref 0.3–1.2)
Total Protein: 6.3 g/dL — ABNORMAL LOW (ref 6.5–8.1)

## 2018-05-26 LAB — LIPASE, BLOOD: LIPASE: 27 U/L (ref 11–51)

## 2018-05-26 MED ORDER — IOPAMIDOL (ISOVUE-300) INJECTION 61%
100.0000 mL | Freq: Once | INTRAVENOUS | Status: AC | PRN
  Administered 2018-05-26: 100 mL via INTRAVENOUS

## 2018-05-26 MED ORDER — CEFTRIAXONE SODIUM 1 G IJ SOLR
1.0000 g | Freq: Once | INTRAMUSCULAR | Status: AC
  Administered 2018-05-26: 1 g via INTRAVENOUS
  Filled 2018-05-26: qty 10

## 2018-05-26 MED ORDER — SODIUM CHLORIDE (PF) 0.9 % IJ SOLN
INTRAMUSCULAR | Status: AC
  Filled 2018-05-26: qty 50

## 2018-05-26 MED ORDER — CEPHALEXIN 500 MG PO CAPS: 500.0000 mg | ORAL_CAPSULE | Freq: Four times a day (QID) | ORAL | 0 refills | Status: DC

## 2018-05-26 MED ORDER — TRAMADOL HCL 50 MG PO TABS: 50.0000 mg | ORAL_TABLET | Freq: Four times a day (QID) | ORAL | 0 refills | Status: DC | PRN

## 2018-05-26 MED ORDER — SODIUM CHLORIDE 0.9% FLUSH: 3.0000 mL | Freq: Once | INTRAVENOUS | Status: DC

## 2018-05-26 MED ORDER — IOPAMIDOL (ISOVUE-300) INJECTION 61%
INTRAVENOUS | Status: AC
  Filled 2018-05-26: qty 100

## 2018-05-26 NOTE — ED Triage Notes (Signed)
Pt c/o abdominal pain  X 2 weeks.  Vomiting.  No fever.  Having constipation.

## 2018-05-26 NOTE — ED Provider Notes (Signed)
[place] DEPT Provider Note   CSN: [id number] Arrival date & time: 05/26/18  1257     History   Chief Complaint Chief Complaint  Patient presents with  . Abdominal Pain  . Emesis    HPI Eric Day is a 58 y.o. male.  The history is provided by the patient and medical records. No language interpreter was used.  Abdominal Pain  Associated symptoms: vomiting   Emesis  Associated symptoms: abdominal pain      58 year old male with history of colon cancer, prior MI, recurrent falls presenting for evaluation of abdominal pain.  Patient is hard of hearing.  Patient reports 2 weeks ago he developed diffuse abdominal pain with associate nausea and vomiting.  It came and went but his pain returns today.  He described pain as a sharp sensation, states he vomited twice of liquid content.  He was a bit constipated but able to have a bowel movement.  Pain is currently moderate, 8 out of 10.  No report of fever chills no URI symptoms no chest pain shortness of breath or dysuria.  He did take some Advil without adequate relief.  Report history of prostate and colon cancer.  States he was treated in [place] many years ago.  Past Medical History:  Diagnosis Date  . Arthritis   . Colon cancer [facility])    prostate; history of stomach cancer   . Colon cancer ([place])   . Depression   . ED (erectile dysfunction)   . Eye disorder    rt artificial  . Hard of hearing    bilat   . Hypertension   . Legally blind   . Multiple falls   . Myocardial infarction ([place])    2008  . Neurofibromatosis ([place])   . Nocturia   . Prosthetic eye globe    right eye   . Shortness of breath dyspnea    walking distances or running   . Tuberculosis    70's  . Wears glasses     Patient Active Problem List   Diagnosis Date Noted  . Prostate cancer ([place]) 05/07/2015  . 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 ABDOMINAL SURGERY  90's   stomach?  . ANTERIOR CERVICAL DECOMP/DISCECTOMY FUSION N/A 07/07/2014   Procedure: ANTERIOR CERVICAL DECOMPRESSION/DISCECTOMY FUSION CERVICAL THREE-FOUR;  Surgeon: Kary Kos, MD;  Location: [place] [facility];  Service: Neurosurgery;  Laterality: N/A;  . CARDIAC CATHETERIZATION    . EYE SURGERY  90's  . HERNIA REPAIR     hernia as child  . ROBOT ASSISTED LAPAROSCOPIC RADICAL PROSTATECTOMY N/A 05/07/2015   Procedure: ROBOTIC ASSISTED LAPAROSCOPIC RADICAL PROSTATECTOMY  LEVEL 3 LYSIS OF ADHESIONS/SMALL BOWEL RESECTION AND ANASTAMOSIS;  Surgeon: Raynelle Bring, MD;  Location: [facility];  Service: Urology;  Laterality: N/A;        Home Medications    Prior to Admission medications   Medication Sig Start Date End Date Taking? Authorizing Provider  polyethylene glycol powder (GLYCOLAX/MIRALAX) powder Take 17 g by mouth 2 (two) times daily. 11/29/15   Waynetta Pean, PA-C  sulfamethoxazole-trimethoprim (BACTRIM DS,SEPTRA DS) 800-160 MG tablet Take 1 tablet by mouth 2 (two) times daily. Start the day prior to foley removal appointment 05/07/15   Debbrah Alar, PA-C  traMADol (ULTRAM) 50 MG tablet Take 1 tablet (50 mg total) by mouth every 6 (six) hours as needed for moderate pain (May take two tablets  every six hours if needed for pain). 05/08/15   Debbrah Alar, PA-C    Family History Family History  Problem Relation Age of Onset  . Hypertension Mother   . Lung cancer Father   . Hypertension Father   . Diabetes Brother     Social History Social History   Tobacco Use  . Smoking status: Current Every Day Smoker    Packs/day: 0.50    Years: 40.00    Pack years: 20.00    Types: Cigarettes  . Smokeless tobacco: Never Used  Substance Use Topics  . 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   Review of Systems Review of Systems  Gastrointestinal: Positive for abdominal pain and vomiting.  All other systems reviewed and are negative.    Physical Exam Updated Vital Signs BP 126/78 (BP Location: Left Arm)   Pulse 82   Resp 20   SpO2 100%   Physical Exam Vitals signs and nursing note reviewed.  Constitutional:      General: He is not in acute distress.    Appearance: He is well-developed.     Comments: Hard of hearing  HENT:     Head: Atraumatic.  Eyes:     Conjunctiva/sclera: Conjunctivae normal.  Neck:     Musculoskeletal: Neck supple.  Cardiovascular:     Rate and Rhythm: Normal rate and regular rhythm.  Pulmonary:     Effort: Pulmonary effort is normal.     Breath sounds: Normal breath sounds.  Abdominal:     General: Bowel sounds are decreased. There is no distension.     Palpations: Abdomen is soft.     Tenderness: There is generalized abdominal tenderness.     Hernia: No hernia is present.  Skin:    Findings: No rash.  Neurological:     Mental Status: He is alert.      ED Treatments / Results  Labs (all labs ordered are listed, but only abnormal results are displayed) Labs Reviewed  COMPREHENSIVE METABOLIC PANEL - Abnormal; Notable for the following components:      Result Value   Glucose, Bld 103 (*)    Total Protein 6.3 (*)    AST 14 (*)    All other components within normal limits  URINALYSIS, ROUTINE W REFLEX MICROSCOPIC - Abnormal; Notable for the following components:   APPearance HAZY (*)    Hgb urine dipstick SMALL (*)    Nitrite POSITIVE (*)    Leukocytes, UA LARGE (*)    WBC, UA >50 (*)    Bacteria, UA MANY (*)    All other components within normal limits  LIPASE, BLOOD  CBC    EKG None  Radiology Ct Abdomen Pelvis W Contrast  Result Date: 05/26/2018 CLINICAL DATA:  Abdominal pain for 2 weeks, vomiting, history of colon cancer, prostate cancer, MI, hypertension, neurofibromatosis, tuberculosis,  smoking EXAM: CT ABDOMEN AND PELVIS WITH CONTRAST TECHNIQUE: Multidetector CT imaging of the abdomen and pelvis was performed using the standard protocol following bolus administration of intravenous contrast. Sagittal and coronal MPR images reconstructed from axial data set. CONTRAST:  1106mL ISOVUE-300 IOPAMIDOL (ISOVUE-300) INJECTION 61% IV. No oral contrast. COMPARISON:  11/05/2010 FINDINGS: Lower chest: Dependent atelectasis at the lung bases. Hepatobiliary: Tiny density at  the inferior gallbladder segment question calculus. Remainder of gallbladder and liver normal appearance Pancreas: Normal appearance Spleen: Normal appearance Adrenals/Urinary Tract: LEFT adrenal mass 19 x 18 mm previously 16 x 14 mm. New mass at RIGHT adrenal bed 25 x 25 mm, adrenal gland not definitely visualized on prior study. Kidneys, ureters, and bladder normal appearance. Stomach/Bowel: Prior ileocolic resection with anastomosis at the ascending colon. Additional small bowel anastomosis in the mid abdomen. Rectal wall thickening, slightly irregular, concerning for rectal neoplasm. Remainder of colon unremarkable. Stomach and small bowel loops normal appearance. Vascular/Lymphatic: Aorta normal caliber. Vascular structures patent. New mesenteric adenopathy, 12 mm short axis mesenteric lymph node in mid abdomen image 59. Additional 6 mm mesenteric node image 44, new. Normal sized external iliac nodes bilaterally, 7 mm RIGHT and 8 mm LEFT, minimally larger than on previous exam. No additional adenopathy. Reproductive: Interval prostatectomy with nonvisualization of seminal vesicles Other: Small BILATERAL inguinal hernias containing fat. No free air or free fluid. No inflammatory process. Few scattered cutaneous neurofibromas. Musculoskeletal: Enlargement, deformity, and increased trabeculation of the L3 vertebral body with mixed lytic and sclerotic foci question Paget's disease unchanged. No other focal osseous lesions. Question minimal  diffusely mottled appearance of osseous structures of uncertain etiology. Multifactorial spinal stenosis L4-L5. IMPRESSION: Slight interval increase in size of previously identified LEFT adrenal mass now 19 x 18 mm, which had indeterminate characteristics by prior MR in 2012; with patient's history of colon cancer and neurofibromatosis, this adrenal lesion is nonspecific could represent a metastatic focus, adrenal adenoma, or pheochromocytoma. Prior resection of RIGHT adrenal gland per prior reports with new mass at the RIGHT adrenal bed now 25 x 25 mm, question metastatic disease versus recurrent tumor; pathology of initial tumor unknown, recommend correlation with prior pathology report. Rectal wall thickening highly concerning for rectal neoplasm, recommend endoscopic assessment. New mesenteric adenopathy with 12 mm lymph node noted in the RIGHT mid abdomen, cannot exclude nodal metastasis. Interval prostatectomy. Question cholelithiasis. 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 Spinal stenosis L4-L5. Electronically Signed   By: Lavonia Dana M.D.   On: 05/26/2018 21:47    Procedures Procedures (including critical care time)  Medications Ordered in ED Medications  sodium chloride flush (NS) 0.9 % injection 3 mL (has no administration in time range)     Initial Impression / Assessment and Plan / ED Course  I have reviewed the triage vital signs and the nursing notes.  Pertinent labs & imaging results that were available during my care of the patient were reviewed by me and considered in my medical decision making (see chart for details).     BP 112/68 (BP Location: Right Arm)   Pulse 65   Temp 98.3 F (36.8 C) (Oral)   Resp 16   SpO2 98%    Final Clinical Impressions(s) / ED Diagnoses   Final diagnoses:  Acute lower UTI  Abnormal abdominal CT scan    ED Discharge Orders         Ordered    traMADol (ULTRAM) 50 MG tablet  Every 6 hours PRN     05/26/18 2223    cephALEXin  (KEFLEX) 500 MG capsule  4 times daily     05/26/18 2223         5:48 PM Patient with known history of colon cancer and prostate cancer here with abdominal pain associate nausea and vomiting.  Abdomen is diffusely tender on initial exam.  He would benefit from a CT scan for further evaluation.  Work-up initiated.  8:15 PM UA with nitrite positive and large leukocyte Estrace along with greater than 50 WBC and many bacteria consistent with a urinary tract infection.  Labs otherwise reassuring.  Rocephin initiated.  Awaits CT scan.  10:12 PM CT of abdomen and pelvis demonstrate interval increase in size of left adrenal mass compared to prior.  New mass to the right adrenal bed for potential metastatic disease versus recurrent tumor rectal wall thickening concerning for rectal neoplasm.  New mesenteric adenopathy.  I have consulted on-call oncologist, Dr. Benay Spice, patient follow-up outpatient for further care.  Patient also made aware of findings and will follow-up with PCP.  In the meantime, he will be discharged with Keflex as treatment for urinary tract infection.  Return precautions discussed.   Domenic Moras, PA-C 05/26/18 2227    Fredia Sorrow, MD 06/05/18 [phone]

## 2018-05-26 NOTE — Discharge Instructions (Signed)
You have been evaluated for your abdominal pain.  Your urine shows evidence of urinary tract infection.  Take antibiotic as prescribed.  Take pain medication as needed.  Your abdominal and pelvis CT shows finding concerning of recurrent cancer.  Discuss this with your primary care provider.  The cancer center will contact you for further outpatient follow up.

## 2018-06-04 ENCOUNTER — Telehealth: Payer: Self-pay

## 2018-06-04 NOTE — Telephone Encounter (Signed)
Spoke with Migdalia Dk - case worker. Appointment date and time of initial consult given with instructions to arrive 30 minutes early. I have spoken to patient and he does not know where he was treated for cancer in the past. Caseworker does not know either. She will ask patient again and relay info to me.

## 2018-06-05 ENCOUNTER — Encounter: Payer: Self-pay | Admitting: Oncology

## 2018-06-05 ENCOUNTER — Telehealth: Payer: Self-pay | Admitting: Oncology

## 2018-06-05 NOTE — Telephone Encounter (Signed)
A new patient appt has been scheduled for the pt to see Dr. Benay Spice on 3/2 at 2pm. Letter mailed to the pt. Dawn will notify the pt.

## 2018-06-18 ENCOUNTER — Telehealth: Payer: Self-pay | Admitting: Oncology

## 2018-06-18 ENCOUNTER — Inpatient Hospital Stay: Payer: Medicare Other | Attending: Oncology | Admitting: Oncology

## 2018-06-18 ENCOUNTER — Other Ambulatory Visit: Payer: Self-pay

## 2018-06-18 VITALS — BP 131/87 | HR 99 | Temp 97.6°F | Resp 18 | Wt 161.5 lb

## 2018-06-18 DIAGNOSIS — Z85028 Personal history of other malignant neoplasm of stomach: Secondary | ICD-10-CM

## 2018-06-18 DIAGNOSIS — Q8500 Neurofibromatosis, unspecified: Secondary | ICD-10-CM

## 2018-06-18 DIAGNOSIS — Z8546 Personal history of malignant neoplasm of prostate: Secondary | ICD-10-CM

## 2018-06-18 DIAGNOSIS — C61 Malignant neoplasm of prostate: Secondary | ICD-10-CM

## 2018-06-18 DIAGNOSIS — Z85038 Personal history of other malignant neoplasm of large intestine: Secondary | ICD-10-CM | POA: Diagnosis not present

## 2018-06-18 DIAGNOSIS — E279 Disorder of adrenal gland, unspecified: Secondary | ICD-10-CM | POA: Diagnosis not present

## 2018-06-18 NOTE — Telephone Encounter (Signed)
Gave avs and calendar ° °

## 2018-06-18 NOTE — Progress Notes (Deleted)
  [name]   Requesting MD: Antony Contras, Md [address] [place], [place] [address]   Eric Day 58 y.o.  1961-04-08    Reason for Consult: ***   HPI: ***  Past Medical History:  Diagnosis Date  . Arthritis   . Colon cancer [facility])    prostate; history of stomach cancer   . Colon cancer ([place])   . Depression   . ED (erectile dysfunction)   . Eye disorder    rt artificial  . Hard of hearing    bilat   . Hypertension   . Legally blind   . Multiple falls   . Myocardial infarction ([place])    2008  . Neurofibromatosis ([place])   . Nocturia   . Prosthetic eye globe    right eye   . Shortness of breath dyspnea    walking distances or running   . Tuberculosis    70's  . Wears glasses     Past Surgical History:  Procedure Laterality Date  . ABDOMINAL SURGERY  90's   stomach?  . ANTERIOR CERVICAL DECOMP/DISCECTOMY FUSION N/A 07/07/2014   Procedure: ANTERIOR CERVICAL DECOMPRESSION/DISCECTOMY FUSION CERVICAL THREE-FOUR;  Surgeon: Kary Kos, MD;  Location: [facility];  Service: Neurosurgery;  Laterality: N/A;  . CARDIAC CATHETERIZATION    . EYE SURGERY  90's  . HERNIA REPAIR     hernia as child  . ROBOT ASSISTED LAPAROSCOPIC RADICAL PROSTATECTOMY N/A 05/07/2015   Procedure: ROBOTIC ASSISTED LAPAROSCOPIC RADICAL PROSTATECTOMY  LEVEL 3 LYSIS OF ADHESIONS/SMALL BOWEL RESECTION AND ANASTAMOSIS;  Surgeon: Raynelle Bring, MD;  Location: [facility];  Service: Urology;  Laterality: N/A;    Medications: Reviewed  Allergies: No Known Allergies  Family history: ***  Social History:   ***  ROS:   Positives include:  A complete ROS was otherwise negative.  Physical Exam:  Blood pressure 131/87, pulse 99, temperature 97.6 F (36.4 C), temperature source Oral, resp. rate 18, weight 161 lb 8 oz (73.3 kg), SpO2 100 %.  HEENT: *** Lungs: *** Cardiac: *** Abdomen: *** GU: ***  Vascular: *** Lymph nodes: *** Neurologic:  *** Skin: *** Musculoskeletal: ***   LAB:  CBC  Lab Results  Component Value Date   WBC 9.1 05/26/2018   HGB 14.2 05/26/2018   HCT 46.3 05/26/2018   MCV 86.5 05/26/2018   PLT 314 05/26/2018   NEUTROABS 4.1 06/28/2014        CMP  Lab Results  Component Value Date   NA 139 05/26/2018   K 4.3 05/26/2018   CL 105 05/26/2018   CO2 27 05/26/2018   GLUCOSE 103 (H) 05/26/2018   BUN 7 05/26/2018   CREATININE 0.85 05/26/2018   CALCIUM 9.5 05/26/2018   PROT 6.3 (L) 05/26/2018   ALBUMIN 3.9 05/26/2018   AST 14 (L) 05/26/2018   ALT 22 05/26/2018   ALKPHOS 75 05/26/2018   BILITOT 1.1 05/26/2018   GFRNONAA >60 05/26/2018   GFRAA >60 05/26/2018     No results found for: CEA1  Imaging:  No results found.    Assessment/Plan:   1. ***  *** 2.    Disposition:   ***  Betsy Coder, MD  06/18/2018, 2:56 PM

## 2018-06-18 NOTE — Progress Notes (Signed)
[facility] New Patient Consult   Requesting MD: Antony Contras, Md [address] [place], [place] [address]   Eric Day 58 y.o.  28-Jul-1960    Reason for Consult: Rectal thickening, right adrenal mass   HPI: Mr. Belt presented to the emergency room on 05/26/2018 with a two-week history of upper abdominal pain.  A CT of the abdomen and pelvis compared to a CT from 11/05/2010 revealed normal appearance of the liver.  A left adrenal mass measured 19 x 18 mm and previously measured 16 x 14 mm.  A new mass was noted at the right adrenal bed measuring 25 x 25 mm.  The right adrenal gland was not visualized on the prior study.  A prior ileocolic resection was noted with anastomosis at the ascending colon.  A small bowel anastomosis was noted in the mid abdomen.  Rectal wall thickening was noted concerning for a neoplasm.  New mesenteric adenopathy with a 12 mm node in the mid abdomen.  Interval prostatectomy.  Deformity and increased trabeculation of L3 unchanged.  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  He underwent a colonoscopy by Dr. Amedeo Plenty on 09/04/2013.  And into signed ileocolonic anastomosis was noted at the hepatic flexure.  A sessile polyp was removed from the sigmoid colon.  The pathology revealed polypoid colorectal mucosa with no malignancy.  He reports the upper abdominal pain has resolved.  He complains of constipation.  Past Medical History:  Diagnosis Date  . Arthritis   . Colon cancer [facility])    prostate; history of stomach cancer   . Colon cancer ([place])   . Depression   . ED (erectile dysfunction)   . Eye disorder    rt artificial  . Hard of hearing    bilat   . Hypertension   . Legally blind   . Multiple falls   . Myocardial infarction ([place])    2008  . Neurofibromatosis ([place])   . Nocturia   . Prosthetic eye globe    right eye   . Shortness of breath dyspnea    walking distances or running   . Tuberculosis    70's  . Wears glasses     Past Surgical History:  Procedure Laterality Date  . ABDOMINAL SURGERY  90's   stomach?  . ANTERIOR CERVICAL DECOMP/DISCECTOMY FUSION N/A 07/07/2014   Procedure: ANTERIOR CERVICAL DECOMPRESSION/DISCECTOMY FUSION CERVICAL THREE-FOUR;  Surgeon: Kary Kos, MD;  Location: [facility];  Service: Neurosurgery;  Laterality: N/A;  . CARDIAC CATHETERIZATION    . EYE SURGERY  90's  . HERNIA REPAIR     hernia as child  . ROBOT ASSISTED LAPAROSCOPIC RADICAL PROSTATECTOMY N/A 05/07/2015   Procedure: ROBOTIC ASSISTED LAPAROSCOPIC RADICAL PROSTATECTOMY  LEVEL 3 LYSIS OF ADHESIONS/SMALL BOWEL RESECTION AND ANASTAMOSIS;  Surgeon: Raynelle Bring, MD;  Location: [facility];  Service: Urology;  Laterality: N/A;    Medications: Reviewed  Allergies: No Known Allergies  Family history: He reports 2 brothers died of brain tumors.  No other family history of cancer his brothers had neurofibromatosis .  Social History:   He lives with a brother in [place].  He works for the [employer].  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  No alcohol use.  He smokes cigarettes.  No risk factor for HIV or hepatitis.  ROS:   Positives include: Vision loss-legally blind, upper abdominal pain 2 weeks ago-improved, pain in the right lower abdomen, constipation, rectal bleeding per report of his brother, leg cramps at night  A complete ROS was otherwise negative.  Physical Exam:  Blood pressure 131/87, pulse 99, temperature 97.6 F (36.4 C), temperature source Oral, resp. rate 18, weight 161 lb 8 oz (73.3 kg), SpO2 100 %.  HEENT: Oropharynx without visible mass, neck without mass Lungs: Clear bilaterally, no respiratory distress Cardiac: Regular rate and rhythm Abdomen: No hepatosplenomegaly, no mass, tender in the right lower abdomen GU: Testes without  mass Vascular: No leg edema Lymph nodes: No cervical, supraclavicular, axillary, or inguinal nodes Neurologic: Alert and oriented, the motor exam appears intact in the upper and lower extremities bilaterally Skin: Multiple soft mobile cutaneous nodules scattered over the trunk Musculoskeletal: No spine tenderness   LAB:  CBC  Lab Results  Component Value Date   WBC 9.1 05/26/2018   HGB 14.2 05/26/2018   HCT 46.3 05/26/2018   MCV 86.5 05/26/2018   PLT 314 05/26/2018   NEUTROABS 4.1 06/28/2014        CMP  Lab Results  Component Value Date   NA 139 05/26/2018   K 4.3 05/26/2018   CL 105 05/26/2018   CO2 27 05/26/2018   GLUCOSE 103 (H) 05/26/2018   BUN 7 05/26/2018   CREATININE 0.85 05/26/2018   CALCIUM 9.5 05/26/2018   PROT 6.3 (L) 05/26/2018   ALBUMIN 3.9 05/26/2018   AST 14 (L) 05/26/2018   ALT 22 05/26/2018   ALKPHOS 75 05/26/2018   BILITOT 1.1 05/26/2018   GFRNONAA >60 05/26/2018   GFRAA >60 05/26/2018      Imaging: As per HPI- CT images from 05/26/2018-reviewed   Assessment/Plan:   1. Right adrenal mass, rectal wall thickening noted on CT 05/26/2018  2. Reported history of a gastric tumor treated with a partial gastrectomy 1998 3. History of colon cancer 4. History of a colon polyp, tubular adenoma- 01/07/2009 5. Neurofibromatosis 6. Prostate cancer, prostatectomy 05/07/2015-Gleason 7, right and left prostate involved, pT2c, pNX 7. Status post right eye enucleation, legally blind 8. Hearing loss   Disposition:   Mr. Penna is referred for oncology evaluation after he presented to the ER with abdominal pain and was found to have a right adrenal mass and rectal wall thickening on CT.  He has a history of prostate cancer, colon cancer, and "gastric cancer ".  We do not have records available for the colon or gastric cancers.  Mr. Limbach has neurofibromatosis.  He is at increased risk for neural tumors and sarcomas (GIST)  He will be referred for a staging  PET scan.  I will refer him to Dr. Watt Climes to consider a sigmoidoscopy or colonoscopy for further evaluation of the rectal wall thickening and his complaint of constipation.  He will continue MiraLAX and add Senokot for the constipation.  We will arrange for a biopsy of the adrenal mass or another lesion pending the PET and sigmoidoscopy results.  He will return for an office visit in approximately 2 weeks. Betsy Coder, MD  06/18/2018, 5:00 PM

## 2018-06-19 ENCOUNTER — Telehealth: Payer: Self-pay

## 2018-06-19 NOTE — Telephone Encounter (Signed)
Called Blossom Hoops, patient's friend, to advise of PET scan D/T/L and NPO status. Patient to arrive at 12:30 for a 1 PM scan. [name] has my direct phone number for questions or concerns.

## 2018-06-26 ENCOUNTER — Encounter (HOSPITAL_COMMUNITY)
Admission: RE | Admit: 2018-06-26 | Discharge: 2018-06-26 | Disposition: A | Discharge status: Home / Self Care | Payer: Medicare Other | Source: Ambulatory Visit | Attending: Oncology | Admitting: Oncology

## 2018-06-26 DIAGNOSIS — C61 Malignant neoplasm of prostate: Secondary | ICD-10-CM | POA: Insufficient documentation

## 2018-06-26 LAB — GLUCOSE, CAPILLARY: Glucose-Capillary: 93 mg/dL (ref 70–99)

## 2018-06-26 MED ORDER — FLUDEOXYGLUCOSE F - 18 (FDG) INJECTION
6.8800 | Freq: Once | INTRAVENOUS | Status: AC | PRN
  Administered 2018-06-26: 6.88 via INTRAVENOUS

## 2018-06-29 ENCOUNTER — Telehealth: Payer: Self-pay

## 2018-06-29 NOTE — Telephone Encounter (Signed)
Blossom Hoops, patient's friend and driver, called to say that pt has appointment with GI on Monday 3/16 @ 11. Patient rescheduled to come in on 07/03/18 @ 2:45.

## 2018-07-02 ENCOUNTER — Ambulatory Visit: Payer: Medicare Other | Admitting: Nurse Practitioner

## 2018-07-03 ENCOUNTER — Other Ambulatory Visit: Payer: Self-pay

## 2018-07-03 ENCOUNTER — Telehealth: Payer: Self-pay | Admitting: Nurse Practitioner

## 2018-07-03 ENCOUNTER — Encounter: Payer: Self-pay | Admitting: Nurse Practitioner

## 2018-07-03 ENCOUNTER — Inpatient Hospital Stay (HOSPITAL_BASED_OUTPATIENT_CLINIC_OR_DEPARTMENT_OTHER): Payer: Medicare Other | Admitting: Nurse Practitioner

## 2018-07-03 VITALS — BP 126/80 | HR 93 | Temp 97.7°F | Resp 18 | Ht 66.0 in | Wt 161.5 lb

## 2018-07-03 DIAGNOSIS — Q8500 Neurofibromatosis, unspecified: Secondary | ICD-10-CM | POA: Diagnosis not present

## 2018-07-03 DIAGNOSIS — Z85028 Personal history of other malignant neoplasm of stomach: Secondary | ICD-10-CM

## 2018-07-03 DIAGNOSIS — E279 Disorder of adrenal gland, unspecified: Secondary | ICD-10-CM | POA: Diagnosis not present

## 2018-07-03 DIAGNOSIS — R109 Unspecified abdominal pain: Secondary | ICD-10-CM

## 2018-07-03 DIAGNOSIS — Z85038 Personal history of other malignant neoplasm of large intestine: Secondary | ICD-10-CM

## 2018-07-03 DIAGNOSIS — Z8546 Personal history of malignant neoplasm of prostate: Secondary | ICD-10-CM | POA: Diagnosis not present

## 2018-07-03 DIAGNOSIS — C7A00 Malignant carcinoid tumor of unspecified site: Secondary | ICD-10-CM

## 2018-07-03 NOTE — Progress Notes (Addendum)
Eric Day   Diagnosis: Rectal thickening, right adrenal mass  INTERVAL HISTORY:   Eric Day returns as scheduled.  He continues to have intermittent pain at the right abdomen but notes that this has improved over the past month.  Bowels are moving.  No diarrhea.  No nausea or vomiting.  Objective:  Vital signs in last 24 hours:  Blood pressure 126/80, pulse 93, temperature 97.7 F (36.5 C), temperature source Oral, resp. rate 18, height 5\' 6"  (1.676 m), weight 161 lb 8 oz (73.3 kg), SpO2 100 %.   Resp: Lungs clear bilaterally. Cardio: Regular rate and rhythm. GI: Abdomen is soft.  Mild tenderness at the right abdomen.  No hepatomegaly.  No mass. Vascular: No leg edema. Skin: Multiple cutaneous nodules scattered over the trunk.   Lab Results:  Lab Results  Component Value Date   WBC 9.1 05/26/2018   HGB 14.2 05/26/2018   HCT 46.3 05/26/2018   MCV 86.5 05/26/2018   PLT 314 05/26/2018   NEUTROABS 4.1 06/28/2014    Imaging: PET images from 06/26/2018 and June 2006 reviewed Medications: I have reviewed the patient's current medications.  Assessment/Plan: 1. Right adrenal mass, rectal wall thickening noted on CT 05/26/2018  PET scan 06/26/2018- (compared to PET scan 10/12/2004 ) moderate to marked increased uptake associated with enlarging left adrenal nodule.  New nodule within the right adrenal bed status post adrenal gland resection.  No significant radiotracer uptake localizing to the rectum.  Scattered soft tissue nodules identified within the skin and subcutaneous soft tissues which exhibit mild increased uptake.  Mild nonspecific uptake associated with recently described new 12 mm mesenteric lymph node.  Colonoscopy 07/02/2018- reported negative (report not available at this time)  2. 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 ? History of carcinoid tumor proximal transverse colon 4.  History of a colon polyp, tubular adenoma- 01/07/2009 5. Neurofibromatosis 6. Prostate cancer, prostatectomy 05/07/2015-Gleason 7, right and left prostate involved, pT2c, pNX 7. Status post right eye enucleation, legally blind 8. Hearing loss  Disposition: Eric Day appears stable.  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  Dr. Benay Spice reviewed the recent PET scan report/images with Eric Day and his family member.  They understand the adrenal masses were present on a PET scan from 2006.  They further understand the right-sided abdominal pain is unlikely related to the adrenal masses or the mesenteric lymph node.  His pain has improved over the past month.  He will return to the lab today for a chromogranin A level.  His case will be presented at the GI tumor conference.  He will return for a follow-up visit in 6 weeks.  He will contact the office in the interim with any problems.  Patient seen with Dr. Benay Spice.  25 minutes were spent face-to-face at today's visit with the majority of that time involved in counseling/coordination of care.    Ned Card ANP/GNP-BC   07/03/2018  3:21 PM This was a shared visit with Ned Card.  Eric Day was interviewed and examined.  We reviewed PET images with Eric Day and his family. Dr. Watt Climes reports the sigmoidoscopy today revealed no rectal mass.  We reviewed records available electronically and I discussed the case with Dr. Watt Climes.  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  The adrenal  lesions have been present in the past.  A left adrenal nodule has enlarged slightly compared to a PET from 14 years ago.  The etiology of the hypermetabolic adrenal nodules is unclear.  The mesenteric lymph node may be related to the carcinoid tumor.  I will present his case at the GI tumor conference to review imaging studies and decide on the  indication for a biopsy.  He does not appear symptomatic from the adrenal masses or mesenteric lymph node at present.  The etiology of his mild abdominal pain is unclear.  Julieanne Manson, MD

## 2018-07-03 NOTE — Telephone Encounter (Signed)
Scheduled appt per 3/17 los. ° °Printed calendar and avs. °

## 2018-07-04 ENCOUNTER — Inpatient Hospital Stay: Payer: Medicare Other

## 2018-07-04 DIAGNOSIS — E279 Disorder of adrenal gland, unspecified: Secondary | ICD-10-CM | POA: Diagnosis not present

## 2018-07-04 DIAGNOSIS — C7A00 Malignant carcinoid tumor of unspecified site: Secondary | ICD-10-CM

## 2018-07-05 LAB — CHROMOGRANIN A: Chromogranin A (ng/mL): 332.2 ng/mL — ABNORMAL HIGH (ref 0.0–101.8)

## 2018-07-20 ENCOUNTER — Telehealth: Payer: Self-pay

## 2018-07-20 NOTE — Telephone Encounter (Signed)
TC to Pt's caretaker Blossom Hoops) explained to her about the elevated blood test per Lattie Haw and informed her that a repeat test will be done in 4-6 months. Care giver verbalized understanding. Informed of upcoming appointment with Dr. Benay Spice on 08/14/18

## 2018-07-20 NOTE — Telephone Encounter (Signed)
-----   Message from Owens Shark, NP sent at 07/20/2018  3:51 PM EDT ----- Please call his caregiver, the chromogranin A is mildly elevated, likely indicating persistence of carcinoid disease, we do not have a previous level, we will repeat this in the next 4 to 6 months

## 2018-07-27 ENCOUNTER — Telehealth: Payer: Self-pay | Admitting: Nurse Practitioner

## 2018-07-27 ENCOUNTER — Other Ambulatory Visit: Payer: Self-pay | Admitting: Nurse Practitioner

## 2018-07-27 DIAGNOSIS — C7A00 Malignant carcinoid tumor of unspecified site: Secondary | ICD-10-CM

## 2018-07-27 NOTE — Telephone Encounter (Signed)
I contacted Ms. Currie with Dr. Gearldine Shown recommendation for Mr. Ireland to submit a 24-hour urine collection for fractionated catecholamines and metanephrines to rule out a pheochromocytoma.  She expressed understanding and will stop by our office early next week to pick up the collection container.

## 2018-08-03 ENCOUNTER — Other Ambulatory Visit: Payer: Self-pay | Admitting: *Deleted

## 2018-08-03 ENCOUNTER — Inpatient Hospital Stay: Payer: Medicare Other | Attending: Oncology

## 2018-08-03 DIAGNOSIS — C7A00 Malignant carcinoid tumor of unspecified site: Secondary | ICD-10-CM | POA: Diagnosis present

## 2018-08-07 LAB — CATECHOLAMINES,UR.,FREE,24 HR
Dopamine, Rand Ur: 547 ug/L
Dopamine, Ur, 24Hr: 492 ug/24 hr (ref 0–510)
Epinephrine, Rand Ur: 98 ug/L
Epinephrine, U, 24Hr: 88 ug/24 hr — ABNORMAL HIGH (ref 0–20)
Norepinephrine, Rand Ur: 222 ug/L
Norepinephrine,U,24H: 200 ug/24 hr — ABNORMAL HIGH (ref 0–135)
Total Volume: 900

## 2018-08-08 LAB — METANEPHRINES, URINE, 24 HOUR
Metaneph Total, Ur: 1364 ug/L
Metanephrines, 24H Ur: 1228 ug/24 hr — ABNORMAL HIGH (ref 45–290)
Normetanephrine, 24H Ur: 1711 ug/24 hr — ABNORMAL HIGH (ref 82–500)
Normetanephrine, Ur: 1901 ug/L
Total Volume: 900

## 2018-08-10 ENCOUNTER — Telehealth: Payer: Self-pay | Admitting: Oncology

## 2018-08-10 NOTE — Telephone Encounter (Signed)
Scheduled appt per 4/23 sch message - pt friend [name] is aware of appt date and time

## 2018-08-14 ENCOUNTER — Ambulatory Visit: Payer: Medicare Other | Admitting: Oncology

## 2018-09-11 ENCOUNTER — Telehealth: Payer: Self-pay | Admitting: Oncology

## 2018-09-11 NOTE — Telephone Encounter (Signed)
Called patient regarding upcoming [facility] appointment, per patient's request this will be a telephone visit.  °

## 2018-09-12 ENCOUNTER — Telehealth: Payer: Self-pay | Admitting: Oncology

## 2018-09-12 NOTE — Telephone Encounter (Signed)
Called and confirmed appt and verified information.

## 2018-09-13 ENCOUNTER — Inpatient Hospital Stay: Payer: Medicaid Other | Attending: Oncology | Admitting: Oncology

## 2018-09-13 DIAGNOSIS — C7A00 Malignant carcinoid tumor of unspecified site: Secondary | ICD-10-CM

## 2018-09-13 NOTE — Progress Notes (Signed)
[facility] OFFICE VISIT PROGRESS NOTE  I connected Eric Day with on 09/13/18 at  4:00 PM EDT by phone and verified that I am speaking with the correct person using two identifiers.   Mr. Eric Day consented to a telephone visit in lieu of an in person visit.  This is secondary to the Eric Day pandemic  Other persons participating in the visit and their role in the encounter: Blossom Hoops, caretaker  Patient's location: Home Provider's location: Home    Diagnosis: Adrenal mass, history of carcinoid tumor  INTERVAL HISTORY:   Mr. Eric Day continues to have discomfort near the right groin.  The abdominal pain he experienced earlier this year has improved.  No fever, sweats, or headache.  He has intermittent constipation.  No diarrhea.  He reports foul-smelling urine.  No burning with urination.  Good appetite.  Lab Results:  Lab Results  Component Value Date   WBC 9.1 05/26/2018   HGB 14.2 05/26/2018   HCT 46.3 05/26/2018   MCV 86.5 05/26/2018   PLT 314 05/26/2018   NEUTROABS 4.1 06/28/2014  07/04/2018: Chromogranin a level 332  08/03/2018: 24-hour urine metanephrines 1228, no metanephrines 1711, epinephrine 88   Medications: I have reviewed the patient's current medications.  Assessment/Plan: 1. Right adrenal mass, rectal wall thickening noted on CT 05/26/2018  PET scan 06/26/2018- (compared to PET scan 10/12/2004 ) moderate to marked increased uptake associated with enlarging left adrenal nodule.  New nodule within the right adrenal bed status post adrenal gland resection.  No significant radiotracer uptake localizing to the rectum.  Scattered soft tissue nodules identified within the skin and subcutaneous soft tissues which exhibit mild increased uptake.  Mild nonspecific uptake associated with recently described new 12 mm mesenteric lymph node.  Elevated chromogranin A level on 07/04/2018  Elevated 24-hour urine metanephrines, no  metanephrines, and epinephrine levels on 08/03/2018  Colonoscopy 07/02/2018- reported negative (report not available at this time)  2. 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 ? History of carcinoid tumor proximal transverse colon 4. History of a colon polyp, tubular adenoma- 01/07/2009 5. Neurofibromatosis 6. Prostate cancer, prostatectomy 05/07/2015-Gleason 7, right and left prostate involved, pT2c,pNX 7. Status post right eye enucleation, legally blind 8. Hearing loss   Disposition: Mr. Eric Day has a history of multiple cancers including prostate cancer, a carcinoid tumor, and "stomach cancer ".  There is a left adrenal nodule, right a adrenal bed nodule, and mesenteric lymph node on staging PET scan.  These are in addition to multiple cutaneous nodules.  The 24-hour urine suggest the possibility of a pheochromocytoma.  He does not appear symptomatic and the left adrenal lesion has been present for many years. He could also have recurrence of the carcinoid disease.  I discussed the diagnostic possibilities with Mr. Eric Day and his caretaker.  He agrees to consultation with Dr. Leamon Eric Day at [facility] to discuss the indication for further laboratory testing and a biopsy.  He will follow-up with his primary physician tomorrow to check a urinalysis.  He will be scheduled for an office visit at the Cancer center in approximately 1 month.  I am available to see him sooner as needed.   I discussed the assessment and treatment plan with the patient. The patient was provided an opportunity to ask questions and all were answered. The patient agreed with the plan and demonstrated an understanding of the instructions.   The patient was advised to  call back or seek an in-person evaluation if the symptoms worsen or if the condition fails to improve as anticipated.  I provided 25 minutes of telephone, chart review, and documentation time during this encounter, and  > 50% was spent counseling as documented under my assessment & plan.  Betsy Coder ANP/GNP-BC   09/13/2018 3:28 PM

## 2018-09-14 ENCOUNTER — Telehealth: Payer: Self-pay | Admitting: Oncology

## 2018-09-14 NOTE — Telephone Encounter (Signed)
Scheduled appt per 5/28 los.  Was not able to reach anyone.

## 2018-09-18 ENCOUNTER — Telehealth: Payer: Self-pay | Admitting: Oncology

## 2018-09-18 NOTE — Telephone Encounter (Signed)
FAXED RECORDS TO DR MORSE AT [facility].

## 2018-09-21 DIAGNOSIS — H4720 Unspecified optic atrophy: Secondary | ICD-10-CM | POA: Diagnosis not present

## 2018-09-21 DIAGNOSIS — H40002 Preglaucoma, unspecified, left eye: Secondary | ICD-10-CM | POA: Diagnosis not present

## 2018-09-21 DIAGNOSIS — Q8500 Neurofibromatosis, unspecified: Secondary | ICD-10-CM | POA: Diagnosis not present

## 2018-09-24 DIAGNOSIS — C61 Malignant neoplasm of prostate: Secondary | ICD-10-CM | POA: Diagnosis not present

## 2018-09-24 DIAGNOSIS — D3A00 Benign carcinoid tumor of unspecified site: Secondary | ICD-10-CM | POA: Diagnosis not present

## 2018-10-11 DIAGNOSIS — H4720 Unspecified optic atrophy: Secondary | ICD-10-CM | POA: Diagnosis not present

## 2018-10-11 DIAGNOSIS — Q8500 Neurofibromatosis, unspecified: Secondary | ICD-10-CM | POA: Diagnosis not present

## 2018-10-18 ENCOUNTER — Inpatient Hospital Stay: Payer: Medicare Other

## 2018-10-18 ENCOUNTER — Ambulatory Visit: Payer: Medicaid Other | Admitting: Nurse Practitioner

## 2018-10-18 ENCOUNTER — Telehealth: Payer: Self-pay

## 2018-10-18 ENCOUNTER — Telehealth: Payer: Self-pay | Admitting: Nurse Practitioner

## 2018-10-18 NOTE — Telephone Encounter (Signed)
Scheduled appt per 7/2 sch message- pt aware of appt date and time

## 2018-10-18 NOTE — Telephone Encounter (Signed)
T/C from Mr. Saal's Care Coordinator [name]) in reference to Pt's appointment she stated Pt. Needed to reschedule appointment. Message sent to scheduling to reschedule lab and appointment with Ned Card NP on October 24 2018.

## 2018-10-22 DIAGNOSIS — D3501 Benign neoplasm of right adrenal gland: Secondary | ICD-10-CM | POA: Diagnosis not present

## 2018-10-24 ENCOUNTER — Inpatient Hospital Stay: Payer: Medicare Other | Admitting: Nurse Practitioner

## 2018-10-24 ENCOUNTER — Telehealth: Payer: Self-pay | Admitting: *Deleted

## 2018-10-24 ENCOUNTER — Telehealth: Payer: Self-pay | Admitting: Oncology

## 2018-10-24 ENCOUNTER — Inpatient Hospital Stay: Payer: Medicare Other

## 2018-10-24 NOTE — Telephone Encounter (Signed)
Scheduled appt per 7/08 sch message - pt friend Enos Fling aware of appt date and time

## 2018-10-24 NOTE — Telephone Encounter (Signed)
Received call from pt's caregiver, Eric Day. Advised her that Eric Day does not need to come in to the cancer center today per Eric Card, Eric Day.  She and Eric Day are waiting for additional information and recommendations from Eric Day @ Eric Day. Advised that his appt would be rescheduled in the next 3 -4 weeks.  Eric Day voiced understanding

## 2018-11-22 ENCOUNTER — Inpatient Hospital Stay: Payer: Medicare Other | Attending: Oncology | Admitting: Oncology

## 2018-11-22 ENCOUNTER — Other Ambulatory Visit: Payer: Self-pay

## 2018-11-22 VITALS — BP 109/69 | HR 90 | Temp 98.3°F | Resp 17 | Ht 66.0 in | Wt 164.5 lb

## 2018-11-22 DIAGNOSIS — K5900 Constipation, unspecified: Secondary | ICD-10-CM | POA: Diagnosis not present

## 2018-11-22 DIAGNOSIS — M542 Cervicalgia: Secondary | ICD-10-CM | POA: Diagnosis not present

## 2018-11-22 DIAGNOSIS — H9190 Unspecified hearing loss, unspecified ear: Secondary | ICD-10-CM | POA: Insufficient documentation

## 2018-11-22 DIAGNOSIS — Z79899 Other long term (current) drug therapy: Secondary | ICD-10-CM | POA: Diagnosis not present

## 2018-11-22 DIAGNOSIS — Z86010 Personal history of colonic polyps: Secondary | ICD-10-CM | POA: Insufficient documentation

## 2018-11-22 DIAGNOSIS — H548 Legal blindness, as defined in USA: Secondary | ICD-10-CM | POA: Diagnosis not present

## 2018-11-22 DIAGNOSIS — E279 Disorder of adrenal gland, unspecified: Secondary | ICD-10-CM | POA: Insufficient documentation

## 2018-11-22 DIAGNOSIS — Z903 Acquired absence of stomach [part of]: Secondary | ICD-10-CM | POA: Diagnosis not present

## 2018-11-22 DIAGNOSIS — R109 Unspecified abdominal pain: Secondary | ICD-10-CM | POA: Insufficient documentation

## 2018-11-22 DIAGNOSIS — C7A00 Malignant carcinoid tumor of unspecified site: Secondary | ICD-10-CM

## 2018-11-22 DIAGNOSIS — C61 Malignant neoplasm of prostate: Secondary | ICD-10-CM | POA: Insufficient documentation

## 2018-11-22 DIAGNOSIS — Z8719 Personal history of other diseases of the digestive system: Secondary | ICD-10-CM | POA: Diagnosis not present

## 2018-11-22 DIAGNOSIS — Q8500 Neurofibromatosis, unspecified: Secondary | ICD-10-CM | POA: Diagnosis not present

## 2018-11-22 NOTE — Progress Notes (Signed)
  [facility] OFFICE PROGRESS NOTE   Diagnosis: Adrenal mass, history of carcinoid tumor  INTERVAL HISTORY:   Eric Day returns as scheduled.  He saw Dr. Leamon Arnt on 10/22/2018.  He has been referred for surgery to consider resection of the left adrenal mass and right adrenal bed nodule. Eric Day reports a good appetite.  He has intermittent abdominal pain in various sites.  He also has pain at the upper back/neck.  He is working.  No tachycardia or consistent sweats. He complains of constipation.  Objective:  Vital signs in last 24 hours:  Blood pressure 109/69, pulse 90, temperature 98.3 F (36.8 C), temperature source Oral, resp. rate 17, height 5\' 6"  (1.676 m), weight 164 lb 8 oz (74.6 kg), SpO2 100 %.    Limited physical examination secondary to distancing with the COVID pandemic GI: No mass, nontender, no hepatomegaly     Lab Results:  Lab Results  Component Value Date   WBC 9.1 05/26/2018   HGB 14.2 05/26/2018   HCT 46.3 05/26/2018   MCV 86.5 05/26/2018   PLT 314 05/26/2018   NEUTROABS 4.1 06/28/2014    CMP  Lab Results  Component Value Date   NA 139 05/26/2018   K 4.3 05/26/2018   CL 105 05/26/2018   CO2 27 05/26/2018   GLUCOSE 103 (H) 05/26/2018   BUN 7 05/26/2018   CREATININE 0.85 05/26/2018   CALCIUM 9.5 05/26/2018   PROT 6.3 (L) 05/26/2018   ALBUMIN 3.9 05/26/2018   AST 14 (L) 05/26/2018   ALT 22 05/26/2018   ALKPHOS 75 05/26/2018   BILITOT 1.1 05/26/2018   GFRNONAA >60 05/26/2018   GFRAA >60 05/26/2018     Medications: I have reviewed the patient's current medications.   Assessment/Plan: 1. Right adrenal mass, rectal wall thickening noted on CT 05/26/2018  PET scan 06/26/2018- (compared to PET scan 10/12/2004 ) moderate to marked increased uptake associated with enlarging left adrenal nodule.  New nodule within the right adrenal bed status post adrenal gland resection.  No significant radiotracer uptake localizing to the rectum.   Scattered soft tissue nodules identified within the skin and subcutaneous soft tissues which exhibit mild increased uptake.  Mild nonspecific uptake associated with recently described new 12 mm mesenteric lymph node.  Elevated chromogranin A level on 07/04/2018  Elevated 24-hour urine metanephrines, no metanephrines, and epinephrine levels on 08/03/2018  Colonoscopy 07/02/2018- negative  2. 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 ? History of carcinoid tumor proximal transverse colon 4. History of a colon polyp, tubular adenoma- 01/07/2009 5. Neurofibromatosis 6. Prostate cancer, prostatectomy 05/07/2015-Gleason 7, right and left prostate involved, pT2c,pNX 7. Status post right eye enucleation, legally blind 8. Hearing loss    Disposition: Eric Day appears unchanged.  He is undergoing evaluation at [facility] for a possible pheochromocytoma.  He is scheduled for surgical consultation next week.  I doubt the intermittent abdominal pain is related to the adrenal masses.  He does not appear to have symptoms related to a pheochromocytoma.  He will return for an office visit here in 3 months.  I am available to see him in the interim as needed.  He will follow-up with his primary provider for management of constipation.  Betsy Coder, MD  11/22/2018  1:58 PM

## 2018-11-30 DIAGNOSIS — D3501 Benign neoplasm of right adrenal gland: Secondary | ICD-10-CM | POA: Diagnosis not present

## 2018-11-30 DIAGNOSIS — E278 Other specified disorders of adrenal gland: Secondary | ICD-10-CM | POA: Diagnosis not present

## 2018-11-30 DIAGNOSIS — C7A023 Malignant carcinoid tumor of the transverse colon: Secondary | ICD-10-CM | POA: Diagnosis not present

## 2018-11-30 DIAGNOSIS — Z859 Personal history of malignant neoplasm, unspecified: Secondary | ICD-10-CM | POA: Diagnosis not present

## 2018-11-30 DIAGNOSIS — D3500 Benign neoplasm of unspecified adrenal gland: Secondary | ICD-10-CM | POA: Diagnosis not present

## 2018-12-06 ENCOUNTER — Telehealth: Payer: Self-pay | Admitting: Oncology

## 2018-12-06 NOTE — Telephone Encounter (Signed)
Called and spoke with patient. Confirmed November appt

## 2018-12-13 DIAGNOSIS — C7A023 Malignant carcinoid tumor of the transverse colon: Secondary | ICD-10-CM | POA: Diagnosis not present

## 2018-12-13 DIAGNOSIS — D3500 Benign neoplasm of unspecified adrenal gland: Secondary | ICD-10-CM | POA: Diagnosis not present

## 2018-12-13 DIAGNOSIS — Z859 Personal history of malignant neoplasm, unspecified: Secondary | ICD-10-CM | POA: Diagnosis not present

## 2018-12-13 DIAGNOSIS — E278 Other specified disorders of adrenal gland: Secondary | ICD-10-CM | POA: Diagnosis not present

## 2018-12-13 DIAGNOSIS — D3501 Benign neoplasm of right adrenal gland: Secondary | ICD-10-CM | POA: Diagnosis not present

## 2019-02-14 DIAGNOSIS — D3500 Benign neoplasm of unspecified adrenal gland: Secondary | ICD-10-CM | POA: Diagnosis not present

## 2019-02-14 DIAGNOSIS — Z23 Encounter for immunization: Secondary | ICD-10-CM | POA: Diagnosis not present

## 2019-02-14 DIAGNOSIS — Z5181 Encounter for therapeutic drug level monitoring: Secondary | ICD-10-CM | POA: Diagnosis not present

## 2019-02-19 DIAGNOSIS — D3500 Benign neoplasm of unspecified adrenal gland: Secondary | ICD-10-CM | POA: Diagnosis not present

## 2019-02-22 ENCOUNTER — Inpatient Hospital Stay: Payer: Medicare Other | Attending: Oncology | Admitting: Oncology

## 2019-02-22 ENCOUNTER — Other Ambulatory Visit: Payer: Self-pay | Admitting: *Deleted

## 2019-02-22 ENCOUNTER — Other Ambulatory Visit: Payer: Self-pay

## 2019-02-22 VITALS — BP 115/73 | HR 105 | Temp 98.0°F | Resp 17 | Ht 66.0 in | Wt 171.5 lb

## 2019-02-22 DIAGNOSIS — C7A00 Malignant carcinoid tumor of unspecified site: Secondary | ICD-10-CM | POA: Diagnosis not present

## 2019-02-22 DIAGNOSIS — E279 Disorder of adrenal gland, unspecified: Secondary | ICD-10-CM | POA: Insufficient documentation

## 2019-02-22 DIAGNOSIS — Q8500 Neurofibromatosis, unspecified: Secondary | ICD-10-CM | POA: Insufficient documentation

## 2019-02-22 DIAGNOSIS — Z8719 Personal history of other diseases of the digestive system: Secondary | ICD-10-CM | POA: Insufficient documentation

## 2019-02-22 DIAGNOSIS — R002 Palpitations: Secondary | ICD-10-CM | POA: Insufficient documentation

## 2019-02-22 DIAGNOSIS — C61 Malignant neoplasm of prostate: Secondary | ICD-10-CM | POA: Diagnosis not present

## 2019-02-22 DIAGNOSIS — Z79899 Other long term (current) drug therapy: Secondary | ICD-10-CM | POA: Diagnosis not present

## 2019-02-22 DIAGNOSIS — Z86010 Personal history of colonic polyps: Secondary | ICD-10-CM | POA: Insufficient documentation

## 2019-02-22 DIAGNOSIS — R109 Unspecified abdominal pain: Secondary | ICD-10-CM | POA: Insufficient documentation

## 2019-02-22 DIAGNOSIS — H548 Legal blindness, as defined in USA: Secondary | ICD-10-CM | POA: Diagnosis not present

## 2019-02-22 DIAGNOSIS — H9190 Unspecified hearing loss, unspecified ear: Secondary | ICD-10-CM | POA: Insufficient documentation

## 2019-02-22 DIAGNOSIS — Z903 Acquired absence of stomach [part of]: Secondary | ICD-10-CM | POA: Diagnosis not present

## 2019-02-22 NOTE — Progress Notes (Signed)
[place] OFFICE PROGRESS NOTE   Diagnosis: Pheochromocytoma  INTERVAL HISTORY:   Eric Day returns for a scheduled visit.  He reports a good appetite.  He continues to have intermittent right abdominal pain.  He has noted palpitations. He has been evaluated at [facility] by surgery and endocrinology.  He is undergoing a Decadron suppression test.  Repeat metanephrines returned elevated on 11/30/2018.  He underwent a dotatate PET scan at [facility] on 12/13/2018.  This confirmed tracer uptake above background in the right and left adrenal glands.  2 focal soft tissue nodules were noted in the pelvis with uptake greater than the liver.  There is an unchanged L3 compression fracture with minimal increase uptake.  He is scheduled to return for an endocrinology visit at [facility] on 03/21/2019.  Objective:  Vital signs in last 24 hours:  Blood pressure 115/73, pulse (!) 105, temperature 98 F (36.7 C), temperature source Temporal, resp. rate 17, height 5\' 6"  (1.676 m), weight 171 lb 8 oz (77.8 kg), SpO2 99 %.     Resp: Distant breath sounds, no respiratory distress Cardio: Regular rate and rhythm GI: No hepatosplenomegaly, no mass, mild tenderness in the right lower abdomen Vascular: Trace pitting edema at the ankles bilaterally Skin: Multiple cutaneous nodular lesions  Portacath/PICC-without erythema  Lab Results:  Lab Results  Component Value Date   WBC 9.1 05/26/2018   HGB 14.2 05/26/2018   HCT 46.3 05/26/2018   MCV 86.5 05/26/2018   PLT 314 05/26/2018   NEUTROABS 4.1 06/28/2014    CMP  Lab Results  Component Value Date   NA 139 05/26/2018   K 4.3 05/26/2018   CL 105 05/26/2018   CO2 27 05/26/2018   GLUCOSE 103 (H) 05/26/2018   BUN 7 05/26/2018   CREATININE 0.85 05/26/2018   CALCIUM 9.5 05/26/2018   PROT 6.3 (L) 05/26/2018   ALBUMIN 3.9 05/26/2018   AST 14 (L) 05/26/2018   ALT 22 05/26/2018   ALKPHOS 75 05/26/2018   BILITOT 1.1 05/26/2018   GFRNONAA >60  05/26/2018   GFRAA >60 05/26/2018     Medications: I have reviewed the patient's current medications.   Assessment/Plan:  1. Right adrenal mass, rectal wall thickening noted on CT 05/26/2018  PET scan 06/26/2018- (compared to PET scan 10/12/2004 ) moderate to marked increased uptake associated with enlarging left adrenal nodule.  New nodule within the right adrenal bed status post adrenal gland resection.  No significant radiotracer uptake localizing to the rectum.  Scattered soft tissue nodules identified within the skin and subcutaneous soft tissues which exhibit mild increased uptake.  Mild nonspecific uptake associated with recently described new 12 mm mesenteric lymph node.  Elevated chromogranin A level on 07/04/2018  Elevated 24-hour urine metanephrines, no metanephrines, and epinephrine levels on 08/03/2018  Colonoscopy 07/02/2018- negative  Elevated plasma metanephrines and [facility] on 11/30/2018  PET Dotatate scan at [facility] on 12/13/2018-bilateral increased radiotracer uptake in the renal nodules, radiotracer activity in a segment of small bowel in the pelvis mesenteric soft tissue mass, mild increased radiotracer activity at L3  2. 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 ? History of carcinoid tumor proximal transverse colon 4. History of a colon polyp, tubular adenoma- 01/07/2009 5. Neurofibromatosis 6. Prostate cancer, prostatectomy 05/07/2015-Gleason 7, right and left prostate involved, pT2c,pNX 7. Status post right eye enucleation, legally blind 8. 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  He may have metastatic disease pelvic small bowel, a pelvic soft tissue mass, and the L3 vertebra.  It is possible the metastatic sites are related to another primary such as a carcinoid tumor.  He is undergoing evaluation for the pheochromocytoma by the  endocrinology service at [facility].  He will see them in early December to decide on the indication for surgery. Eric Day will return for office visit in 3 months.  I am available to see him sooner as needed.  Betsy Coder, MD  02/22/2019  10:18 AM

## 2019-02-25 ENCOUNTER — Telehealth: Payer: Self-pay | Admitting: Oncology

## 2019-02-25 NOTE — Telephone Encounter (Signed)
Scheduled per los. Called and left msg. Mailed printout  °

## 2019-03-18 DIAGNOSIS — Z01818 Encounter for other preprocedural examination: Secondary | ICD-10-CM | POA: Diagnosis not present

## 2019-03-21 DIAGNOSIS — D3500 Benign neoplasm of unspecified adrenal gland: Secondary | ICD-10-CM | POA: Diagnosis not present

## 2019-03-29 DIAGNOSIS — D3500 Benign neoplasm of unspecified adrenal gland: Secondary | ICD-10-CM | POA: Diagnosis not present

## 2019-05-01 DIAGNOSIS — E278 Other specified disorders of adrenal gland: Secondary | ICD-10-CM | POA: Diagnosis not present

## 2019-05-09 DIAGNOSIS — Z01818 Encounter for other preprocedural examination: Secondary | ICD-10-CM | POA: Diagnosis not present

## 2019-05-09 DIAGNOSIS — M4712 Other spondylosis with myelopathy, cervical region: Secondary | ICD-10-CM | POA: Diagnosis not present

## 2019-05-09 DIAGNOSIS — Q8501 Neurofibromatosis, type 1: Secondary | ICD-10-CM | POA: Diagnosis not present

## 2019-05-09 DIAGNOSIS — Q8500 Neurofibromatosis, unspecified: Secondary | ICD-10-CM | POA: Diagnosis not present

## 2019-05-09 DIAGNOSIS — D3500 Benign neoplasm of unspecified adrenal gland: Secondary | ICD-10-CM | POA: Diagnosis not present

## 2019-05-09 DIAGNOSIS — Z720 Tobacco use: Secondary | ICD-10-CM | POA: Diagnosis not present

## 2019-05-09 DIAGNOSIS — E278 Other specified disorders of adrenal gland: Secondary | ICD-10-CM | POA: Diagnosis not present

## 2019-05-09 DIAGNOSIS — E559 Vitamin D deficiency, unspecified: Secondary | ICD-10-CM | POA: Diagnosis not present

## 2019-05-09 DIAGNOSIS — C61 Malignant neoplasm of prostate: Secondary | ICD-10-CM | POA: Diagnosis not present

## 2019-05-11 DIAGNOSIS — Z01818 Encounter for other preprocedural examination: Secondary | ICD-10-CM | POA: Diagnosis not present

## 2019-05-13 DIAGNOSIS — Z970 Presence of artificial eye: Secondary | ICD-10-CM | POA: Diagnosis not present

## 2019-05-13 DIAGNOSIS — G8912 Acute post-thoracotomy pain: Secondary | ICD-10-CM | POA: Diagnosis not present

## 2019-05-13 DIAGNOSIS — J690 Pneumonitis due to inhalation of food and vomit: Secondary | ICD-10-CM | POA: Diagnosis not present

## 2019-05-13 DIAGNOSIS — D72829 Elevated white blood cell count, unspecified: Secondary | ICD-10-CM | POA: Diagnosis not present

## 2019-05-13 DIAGNOSIS — C786 Secondary malignant neoplasm of retroperitoneum and peritoneum: Secondary | ICD-10-CM | POA: Diagnosis present

## 2019-05-13 DIAGNOSIS — J9601 Acute respiratory failure with hypoxia: Secondary | ICD-10-CM | POA: Diagnosis not present

## 2019-05-13 DIAGNOSIS — C7411 Malignant neoplasm of medulla of right adrenal gland: Secondary | ICD-10-CM | POA: Diagnosis present

## 2019-05-13 DIAGNOSIS — Z85038 Personal history of other malignant neoplasm of large intestine: Secondary | ICD-10-CM | POA: Diagnosis not present

## 2019-05-13 DIAGNOSIS — J811 Chronic pulmonary edema: Secondary | ICD-10-CM | POA: Diagnosis not present

## 2019-05-13 DIAGNOSIS — C7412 Malignant neoplasm of medulla of left adrenal gland: Secondary | ICD-10-CM | POA: Diagnosis present

## 2019-05-13 DIAGNOSIS — D62 Acute posthemorrhagic anemia: Secondary | ICD-10-CM | POA: Diagnosis not present

## 2019-05-13 DIAGNOSIS — Z903 Acquired absence of stomach [part of]: Secondary | ICD-10-CM | POA: Diagnosis not present

## 2019-05-13 DIAGNOSIS — I517 Cardiomegaly: Secondary | ICD-10-CM | POA: Diagnosis not present

## 2019-05-13 DIAGNOSIS — C7951 Secondary malignant neoplasm of bone: Secondary | ICD-10-CM | POA: Diagnosis present

## 2019-05-13 DIAGNOSIS — E785 Hyperlipidemia, unspecified: Secondary | ICD-10-CM | POA: Diagnosis not present

## 2019-05-13 DIAGNOSIS — I252 Old myocardial infarction: Secondary | ICD-10-CM | POA: Diagnosis not present

## 2019-05-13 DIAGNOSIS — E275 Adrenomedullary hyperfunction: Secondary | ICD-10-CM | POA: Diagnosis not present

## 2019-05-13 DIAGNOSIS — E278 Other specified disorders of adrenal gland: Secondary | ICD-10-CM | POA: Diagnosis not present

## 2019-05-13 DIAGNOSIS — C7490 Malignant neoplasm of unspecified part of unspecified adrenal gland: Secondary | ICD-10-CM | POA: Diagnosis not present

## 2019-05-13 DIAGNOSIS — J9811 Atelectasis: Secondary | ICD-10-CM | POA: Diagnosis not present

## 2019-05-13 DIAGNOSIS — K660 Peritoneal adhesions (postprocedural) (postinfection): Secondary | ICD-10-CM | POA: Diagnosis present

## 2019-05-13 DIAGNOSIS — H547 Unspecified visual loss: Secondary | ICD-10-CM | POA: Diagnosis present

## 2019-05-13 DIAGNOSIS — D3501 Benign neoplasm of right adrenal gland: Secondary | ICD-10-CM | POA: Diagnosis not present

## 2019-05-13 DIAGNOSIS — Q8501 Neurofibromatosis, type 1: Secondary | ICD-10-CM | POA: Diagnosis not present

## 2019-05-13 DIAGNOSIS — D3500 Benign neoplasm of unspecified adrenal gland: Secondary | ICD-10-CM | POA: Diagnosis not present

## 2019-05-13 DIAGNOSIS — J449 Chronic obstructive pulmonary disease, unspecified: Secondary | ICD-10-CM | POA: Diagnosis present

## 2019-05-13 DIAGNOSIS — K8010 Calculus of gallbladder with chronic cholecystitis without obstruction: Secondary | ICD-10-CM | POA: Diagnosis not present

## 2019-05-13 DIAGNOSIS — R358 Other polyuria: Secondary | ICD-10-CM | POA: Diagnosis not present

## 2019-05-13 DIAGNOSIS — Z8546 Personal history of malignant neoplasm of prostate: Secondary | ICD-10-CM | POA: Diagnosis not present

## 2019-05-13 DIAGNOSIS — E2740 Unspecified adrenocortical insufficiency: Secondary | ICD-10-CM | POA: Diagnosis present

## 2019-05-13 DIAGNOSIS — J810 Acute pulmonary edema: Secondary | ICD-10-CM | POA: Diagnosis not present

## 2019-05-13 DIAGNOSIS — Z20822 Contact with and (suspected) exposure to covid-19: Secondary | ICD-10-CM | POA: Diagnosis present

## 2019-05-13 DIAGNOSIS — Z9889 Other specified postprocedural states: Secondary | ICD-10-CM | POA: Diagnosis not present

## 2019-05-13 DIAGNOSIS — F17210 Nicotine dependence, cigarettes, uncomplicated: Secondary | ICD-10-CM | POA: Diagnosis present

## 2019-05-13 DIAGNOSIS — Q8500 Neurofibromatosis, unspecified: Secondary | ICD-10-CM | POA: Diagnosis not present

## 2019-05-13 DIAGNOSIS — R918 Other nonspecific abnormal finding of lung field: Secondary | ICD-10-CM | POA: Diagnosis not present

## 2019-05-13 DIAGNOSIS — G8918 Other acute postprocedural pain: Secondary | ICD-10-CM | POA: Diagnosis present

## 2019-05-13 DIAGNOSIS — H9190 Unspecified hearing loss, unspecified ear: Secondary | ICD-10-CM | POA: Diagnosis present

## 2019-05-13 DIAGNOSIS — R0902 Hypoxemia: Secondary | ICD-10-CM | POA: Diagnosis not present

## 2019-05-24 DIAGNOSIS — Z483 Aftercare following surgery for neoplasm: Secondary | ICD-10-CM | POA: Diagnosis not present

## 2019-05-24 DIAGNOSIS — Z8546 Personal history of malignant neoplasm of prostate: Secondary | ICD-10-CM | POA: Diagnosis not present

## 2019-05-24 DIAGNOSIS — Q8501 Neurofibromatosis, type 1: Secondary | ICD-10-CM | POA: Diagnosis not present

## 2019-05-24 DIAGNOSIS — Z9981 Dependence on supplemental oxygen: Secondary | ICD-10-CM | POA: Diagnosis not present

## 2019-05-24 DIAGNOSIS — H905 Unspecified sensorineural hearing loss: Secondary | ICD-10-CM | POA: Diagnosis not present

## 2019-05-24 DIAGNOSIS — J449 Chronic obstructive pulmonary disease, unspecified: Secondary | ICD-10-CM | POA: Diagnosis not present

## 2019-05-24 DIAGNOSIS — J9811 Atelectasis: Secondary | ICD-10-CM | POA: Diagnosis not present

## 2019-05-24 DIAGNOSIS — Z85038 Personal history of other malignant neoplasm of large intestine: Secondary | ICD-10-CM | POA: Diagnosis not present

## 2019-05-24 DIAGNOSIS — C7492 Malignant neoplasm of unspecified part of left adrenal gland: Secondary | ICD-10-CM | POA: Diagnosis not present

## 2019-05-24 DIAGNOSIS — H409 Unspecified glaucoma: Secondary | ICD-10-CM | POA: Diagnosis not present

## 2019-05-24 DIAGNOSIS — D3501 Benign neoplasm of right adrenal gland: Secondary | ICD-10-CM | POA: Diagnosis not present

## 2019-05-27 DIAGNOSIS — Z483 Aftercare following surgery for neoplasm: Secondary | ICD-10-CM | POA: Diagnosis not present

## 2019-05-27 DIAGNOSIS — C7492 Malignant neoplasm of unspecified part of left adrenal gland: Secondary | ICD-10-CM | POA: Diagnosis not present

## 2019-05-27 DIAGNOSIS — J449 Chronic obstructive pulmonary disease, unspecified: Secondary | ICD-10-CM | POA: Diagnosis not present

## 2019-05-27 DIAGNOSIS — Q8501 Neurofibromatosis, type 1: Secondary | ICD-10-CM | POA: Diagnosis not present

## 2019-05-27 DIAGNOSIS — J9811 Atelectasis: Secondary | ICD-10-CM | POA: Diagnosis not present

## 2019-05-27 DIAGNOSIS — H905 Unspecified sensorineural hearing loss: Secondary | ICD-10-CM | POA: Diagnosis not present

## 2019-05-28 ENCOUNTER — Telehealth: Payer: Self-pay | Admitting: *Deleted

## 2019-05-28 ENCOUNTER — Inpatient Hospital Stay: Payer: Medicare Other | Attending: Oncology | Admitting: Oncology

## 2019-05-28 DIAGNOSIS — F17200 Nicotine dependence, unspecified, uncomplicated: Secondary | ICD-10-CM | POA: Diagnosis not present

## 2019-05-28 DIAGNOSIS — Q8500 Neurofibromatosis, unspecified: Secondary | ICD-10-CM | POA: Diagnosis not present

## 2019-05-28 DIAGNOSIS — J45909 Unspecified asthma, uncomplicated: Secondary | ICD-10-CM | POA: Diagnosis not present

## 2019-05-28 DIAGNOSIS — Q8501 Neurofibromatosis, type 1: Secondary | ICD-10-CM | POA: Diagnosis not present

## 2019-05-28 DIAGNOSIS — C61 Malignant neoplasm of prostate: Secondary | ICD-10-CM | POA: Diagnosis not present

## 2019-05-28 DIAGNOSIS — J9811 Atelectasis: Secondary | ICD-10-CM | POA: Diagnosis not present

## 2019-05-28 DIAGNOSIS — H905 Unspecified sensorineural hearing loss: Secondary | ICD-10-CM | POA: Diagnosis not present

## 2019-05-28 DIAGNOSIS — C7492 Malignant neoplasm of unspecified part of left adrenal gland: Secondary | ICD-10-CM | POA: Diagnosis not present

## 2019-05-28 DIAGNOSIS — C7490 Malignant neoplasm of unspecified part of unspecified adrenal gland: Secondary | ICD-10-CM | POA: Diagnosis not present

## 2019-05-28 DIAGNOSIS — I252 Old myocardial infarction: Secondary | ICD-10-CM | POA: Diagnosis not present

## 2019-05-28 DIAGNOSIS — J449 Chronic obstructive pulmonary disease, unspecified: Secondary | ICD-10-CM | POA: Diagnosis not present

## 2019-05-28 DIAGNOSIS — Z483 Aftercare following surgery for neoplasm: Secondary | ICD-10-CM | POA: Diagnosis not present

## 2019-05-28 NOTE — Telephone Encounter (Signed)
Called friend, [name] to f/u on missed appointment today. They were not aware of the appointment. Scheduling message sent to reschedule and to please call Blossom Hoops with appointment.

## 2019-05-29 DIAGNOSIS — H905 Unspecified sensorineural hearing loss: Secondary | ICD-10-CM | POA: Diagnosis not present

## 2019-05-29 DIAGNOSIS — C7492 Malignant neoplasm of unspecified part of left adrenal gland: Secondary | ICD-10-CM | POA: Diagnosis not present

## 2019-05-29 DIAGNOSIS — J9811 Atelectasis: Secondary | ICD-10-CM | POA: Diagnosis not present

## 2019-05-29 DIAGNOSIS — J449 Chronic obstructive pulmonary disease, unspecified: Secondary | ICD-10-CM | POA: Diagnosis not present

## 2019-05-29 DIAGNOSIS — Q8501 Neurofibromatosis, type 1: Secondary | ICD-10-CM | POA: Diagnosis not present

## 2019-05-29 DIAGNOSIS — Z483 Aftercare following surgery for neoplasm: Secondary | ICD-10-CM | POA: Diagnosis not present

## 2019-05-31 ENCOUNTER — Telehealth: Payer: Self-pay | Admitting: Oncology

## 2019-05-31 NOTE — Telephone Encounter (Signed)
Scheduled apt per 2/9 sch message - pt friend Ms. Pamala Duffel  is aware of appt date and time and she will tell pt;s Education officer, [employer]

## 2019-06-03 DIAGNOSIS — D3501 Benign neoplasm of right adrenal gland: Secondary | ICD-10-CM | POA: Diagnosis not present

## 2019-06-05 DIAGNOSIS — C7492 Malignant neoplasm of unspecified part of left adrenal gland: Secondary | ICD-10-CM | POA: Diagnosis not present

## 2019-06-05 DIAGNOSIS — H905 Unspecified sensorineural hearing loss: Secondary | ICD-10-CM | POA: Diagnosis not present

## 2019-06-05 DIAGNOSIS — Q8501 Neurofibromatosis, type 1: Secondary | ICD-10-CM | POA: Diagnosis not present

## 2019-06-05 DIAGNOSIS — J449 Chronic obstructive pulmonary disease, unspecified: Secondary | ICD-10-CM | POA: Diagnosis not present

## 2019-06-05 DIAGNOSIS — J9811 Atelectasis: Secondary | ICD-10-CM | POA: Diagnosis not present

## 2019-06-05 DIAGNOSIS — Z483 Aftercare following surgery for neoplasm: Secondary | ICD-10-CM | POA: Diagnosis not present

## 2019-06-11 DIAGNOSIS — F17200 Nicotine dependence, unspecified, uncomplicated: Secondary | ICD-10-CM | POA: Diagnosis not present

## 2019-06-11 DIAGNOSIS — Q8500 Neurofibromatosis, unspecified: Secondary | ICD-10-CM | POA: Diagnosis not present

## 2019-06-11 DIAGNOSIS — C61 Malignant neoplasm of prostate: Secondary | ICD-10-CM | POA: Diagnosis not present

## 2019-06-11 DIAGNOSIS — J45909 Unspecified asthma, uncomplicated: Secondary | ICD-10-CM | POA: Diagnosis not present

## 2019-06-11 DIAGNOSIS — I252 Old myocardial infarction: Secondary | ICD-10-CM | POA: Diagnosis not present

## 2019-06-11 DIAGNOSIS — C7490 Malignant neoplasm of unspecified part of unspecified adrenal gland: Secondary | ICD-10-CM | POA: Diagnosis not present

## 2019-06-25 ENCOUNTER — Inpatient Hospital Stay: Payer: Medicare Other | Attending: Oncology | Admitting: Oncology

## 2019-06-25 ENCOUNTER — Other Ambulatory Visit: Payer: Self-pay

## 2019-06-25 VITALS — BP 110/79 | HR 105 | Temp 98.5°F | Resp 18 | Ht 66.0 in | Wt 171.9 lb

## 2019-06-25 DIAGNOSIS — Z8719 Personal history of other diseases of the digestive system: Secondary | ICD-10-CM | POA: Diagnosis not present

## 2019-06-25 DIAGNOSIS — Z86010 Personal history of colonic polyps: Secondary | ICD-10-CM | POA: Insufficient documentation

## 2019-06-25 DIAGNOSIS — C7A00 Malignant carcinoid tumor of unspecified site: Secondary | ICD-10-CM

## 2019-06-25 DIAGNOSIS — E279 Disorder of adrenal gland, unspecified: Secondary | ICD-10-CM | POA: Diagnosis not present

## 2019-06-25 DIAGNOSIS — Q8500 Neurofibromatosis, unspecified: Secondary | ICD-10-CM | POA: Diagnosis not present

## 2019-06-25 DIAGNOSIS — H548 Legal blindness, as defined in USA: Secondary | ICD-10-CM | POA: Insufficient documentation

## 2019-06-25 DIAGNOSIS — R197 Diarrhea, unspecified: Secondary | ICD-10-CM | POA: Insufficient documentation

## 2019-06-25 DIAGNOSIS — Z8546 Personal history of malignant neoplasm of prostate: Secondary | ICD-10-CM | POA: Insufficient documentation

## 2019-06-25 DIAGNOSIS — Z903 Acquired absence of stomach [part of]: Secondary | ICD-10-CM | POA: Insufficient documentation

## 2019-06-25 DIAGNOSIS — R0603 Acute respiratory distress: Secondary | ICD-10-CM | POA: Diagnosis not present

## 2019-06-25 DIAGNOSIS — Z79899 Other long term (current) drug therapy: Secondary | ICD-10-CM | POA: Diagnosis not present

## 2019-06-25 NOTE — Progress Notes (Signed)
[place] OFFICE PROGRESS NOTE   Diagnosis: Pheochromocytoma, history of carcinoid tumor  INTERVAL HISTORY:   Eric Day underwent a right adrenalectomy and cholecystectomy on 05/14/2019.  A right adrenal mass was noted.  The adrenal gland was completely excised.  The abdomen was explored for evidence of a carcinoid mesenteric lymph node, but there were prohibitive adhesions in the procedure was aborted.  He was discharged on 05/23/2019.  He had respiratory distress following surgery and was discharged home on oxygen.  The oxygen has been weaned to off.  He is scheduled for follow-up with endocrinology and Dr. Leamon Day next month.  Eric Day is here today with his caregiver.  No fever, night sweats, or headaches.  He has occasional diarrhea, but this is not a consistent problem.  Good appetite.  He is working.  Objective:  Vital signs in last 24 hours:  Blood pressure 110/79, pulse (!) 105, temperature 98.5 F (36.9 C), resp. rate 18, height 5\' 6"  (1.676 m), weight 171 lb 14.4 oz (78 kg), SpO2 98 %.    Resp: Distant breath sounds, no respiratory distress Cardio: Regular rate and rhythm, split S1? GI: Healed surgical incision, no hepatosplenomegaly, no mass Vascular: No leg edema  Skin: Multiple neurofibromas   Lab Results:  Lab Results  Component Value Date   WBC 9.1 05/26/2018   HGB 14.2 05/26/2018   HCT 46.3 05/26/2018   MCV 86.5 05/26/2018   PLT 314 05/26/2018   NEUTROABS 4.1 06/28/2014    CMP  Lab Results  Component Value Date   NA 139 05/26/2018   K 4.3 05/26/2018   CL 105 05/26/2018   CO2 27 05/26/2018   GLUCOSE 103 (H) 05/26/2018   BUN 7 05/26/2018   CREATININE 0.85 05/26/2018   CALCIUM 9.5 05/26/2018   PROT 6.3 (L) 05/26/2018   ALBUMIN 3.9 05/26/2018   AST 14 (L) 05/26/2018   ALT 22 05/26/2018   ALKPHOS 75 05/26/2018   BILITOT 1.1 05/26/2018   GFRNONAA >60 05/26/2018   GFRAA >60 05/26/2018    No results found for: CEA1  Lab Results    Component Value Date   INR 1.03 06/28/2014    Imaging:  No results found.  Medications: I have reviewed the patient's current medications.   Assessment/Plan: 1. Right adrenal mass, rectal wall thickening noted on CT 05/26/2018  PET scan 06/26/2018- (compared to PET scan 10/12/2004 ) moderate to marked increased uptake associated with enlarging left adrenal nodule.  New nodule within the right adrenal bed status post adrenal gland resection.  No significant radiotracer uptake localizing to the rectum.  Scattered soft tissue nodules identified within the skin and subcutaneous soft tissues which exhibit mild increased uptake.  Mild nonspecific uptake associated with recently described new 12 mm mesenteric lymph node.  Elevated chromogranin A level on 07/04/2018  Elevated 24-hour urine metanephrines, no metanephrines, and epinephrine levels on 08/03/2018  Colonoscopy 07/02/2018- negative  Elevated plasma metanephrines and [facility] on 11/30/2018  PET Dotatate scan at [facility] on 12/13/2018-bilateral increased radiotracer uptake in the adrenal nodules, radiotracer activity in a segment of small bowel in the pelvis and mesenteric soft tissue mass, mild increased radiotracer activity at L3  Right adrenalectomy and cholecystectomy 05/14/2019, pathology from the right adrenal revealed a pheochromocytoma with capsular invasion, no lymphovascular invasion or extra adrenal extension, negative resection margins  2. 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 ? History of carcinoid tumor proximal transverse colon 4. History of  a colon polyp, tubular adenoma- 01/07/2009 5. Neurofibromatosis 6. Prostate cancer, prostatectomy 05/07/2015-Gleason 7, right and left prostate involved, pT2c,pNX 7. Status post right eye enucleation, legally blind 8. Hearing loss    Disposition: Eric Day appears well.  He underwent resection of the right adrenal mass on 05/14/2019.  The  pathology confirmed a pheochromocytoma.  The surgeons were unable to evaluate for a mesenteric or small bowel mass secondary to the extent of abdominal adhesions.  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  He is scheduled for follow-up with Dr. Donneta Day and Dr. Leamon Day next month.  I will defer follow-up of the metanephrines and chromogranin a levels to the [facility] team.  I am available to check labs as needed.  He will return for an office visit here in 4 months.  We can initiate systemic therapy with a somatostatin analog if Dr. Leamon Day recommends this.  I encouraged Eric Day to obtain the COVID-19 vaccine. Eric Coder, MD  06/25/2019  4:36 PM

## 2019-06-26 ENCOUNTER — Telehealth: Payer: Self-pay | Admitting: Oncology

## 2019-06-26 NOTE — Telephone Encounter (Signed)
Scheduled per los. Called and spoke with patient. Confirmed appt 

## 2019-09-18 ENCOUNTER — Other Ambulatory Visit: Payer: Self-pay | Admitting: Nurse Practitioner

## 2019-09-18 DIAGNOSIS — R9389 Abnormal findings on diagnostic imaging of other specified body structures: Secondary | ICD-10-CM

## 2019-09-18 DIAGNOSIS — C719 Malignant neoplasm of brain, unspecified: Secondary | ICD-10-CM

## 2019-10-25 ENCOUNTER — Other Ambulatory Visit: Payer: Self-pay

## 2019-10-25 ENCOUNTER — Inpatient Hospital Stay: Payer: Medicare Other | Attending: Oncology | Admitting: Oncology

## 2019-10-25 VITALS — BP 114/81 | HR 71 | Temp 97.5°F | Resp 16 | Ht 66.0 in | Wt 168.2 lb

## 2019-10-25 DIAGNOSIS — Z8719 Personal history of other diseases of the digestive system: Secondary | ICD-10-CM | POA: Insufficient documentation

## 2019-10-25 DIAGNOSIS — C61 Malignant neoplasm of prostate: Secondary | ICD-10-CM | POA: Diagnosis present

## 2019-10-25 DIAGNOSIS — Z79899 Other long term (current) drug therapy: Secondary | ICD-10-CM | POA: Diagnosis not present

## 2019-10-25 DIAGNOSIS — C7A00 Malignant carcinoid tumor of unspecified site: Secondary | ICD-10-CM

## 2019-10-25 DIAGNOSIS — Z86010 Personal history of colonic polyps: Secondary | ICD-10-CM | POA: Diagnosis not present

## 2019-10-25 DIAGNOSIS — E279 Disorder of adrenal gland, unspecified: Secondary | ICD-10-CM | POA: Diagnosis present

## 2019-10-25 DIAGNOSIS — D3501 Benign neoplasm of right adrenal gland: Secondary | ICD-10-CM | POA: Diagnosis not present

## 2019-10-25 DIAGNOSIS — H9190 Unspecified hearing loss, unspecified ear: Secondary | ICD-10-CM | POA: Insufficient documentation

## 2019-10-25 DIAGNOSIS — Q8500 Neurofibromatosis, unspecified: Secondary | ICD-10-CM | POA: Insufficient documentation

## 2019-10-25 DIAGNOSIS — Z903 Acquired absence of stomach [part of]: Secondary | ICD-10-CM | POA: Insufficient documentation

## 2019-10-25 NOTE — Progress Notes (Signed)
[place] OFFICE PROGRESS NOTE   Diagnosis: Pheochromocytoma, neurofibromatosis  INTERVAL HISTORY:   Eric Day returns today as scheduled.  He is here with a caretaker.  He generally feels well.  Good appetite.  He is working.  He has occasional discomfort at the right upper lateral abdomen.  This is generally present when he bends to a certain position.  The pain is relieved when he changes position.  He reports constipation.  No bleeding. He is scheduled for a follow-up brain MRI at [facility] and follow-up with Dr. Leamon Arnt next month.  He is followed by Dr. Donneta Romberg for endocrinology care and follow-up of the metanephrines.  Objective:  Vital signs in last 24 hours:  Blood pressure 114/81, pulse 71, temperature (!) 97.5 F (36.4 C), temperature source Temporal, resp. rate 16, height 5\' 6"  (1.676 m), weight 168 lb 3.2 oz (76.3 kg), SpO2 99 %.     Lymphatics: No cervical, supraclavicular, axillary, or inguinal nodes Resp: Lungs clear bilaterally Cardio: Regular rate and rhythm GI: Mild tenderness in the mid right lateral abdomen.  No mass, no hepatosplenomegaly Vascular: No leg edema Skin: Multiple neurofibromas    Lab Results:  Lab Results  Component Value Date   WBC 9.1 05/26/2018   HGB 14.2 05/26/2018   HCT 46.3 05/26/2018   MCV 86.5 05/26/2018   PLT 314 05/26/2018   NEUTROABS 4.1 06/28/2014    CMP  Lab Results  Component Value Date   NA 139 05/26/2018   K 4.3 05/26/2018   CL 105 05/26/2018   CO2 27 05/26/2018   GLUCOSE 103 (H) 05/26/2018   BUN 7 05/26/2018   CREATININE 0.85 05/26/2018   CALCIUM 9.5 05/26/2018   PROT 6.3 (L) 05/26/2018   ALBUMIN 3.9 05/26/2018   AST 14 (L) 05/26/2018   ALT 22 05/26/2018   ALKPHOS 75 05/26/2018   BILITOT 1.1 05/26/2018   GFRNONAA >60 05/26/2018   GFRAA >60 05/26/2018     Medications: I have reviewed the patient's current medications.   Assessment/Plan: 1. Right adrenal mass, rectal wall thickening noted on  CT 05/26/2018  PET scan 06/26/2018- (compared to PET scan 10/12/2004 ) moderate to marked increased uptake associated with enlarging left adrenal nodule.  New nodule within the right adrenal bed status post adrenal gland resection.  No significant radiotracer uptake localizing to the rectum.  Scattered soft tissue nodules identified within the skin and subcutaneous soft tissues which exhibit mild increased uptake.  Mild nonspecific uptake associated with recently described new 12 mm mesenteric lymph node.  Elevated chromogranin A level on 07/04/2018  Elevated 24-hour urine metanephrines, no metanephrines, and epinephrine levels on 08/03/2018  Colonoscopy 07/02/2018- negative  Elevated plasma metanephrines and [facility] on 11/30/2018  PET Dotatate scan at [facility] on 12/13/2018-bilateral increased radiotracer uptake in the adrenal nodules, radiotracer activity in a segment of small bowel in the pelvis and mesenteric soft tissue mass, mild increased radiotracer activity at L3  Right adrenalectomy and cholecystectomy 05/14/2019, pathology from the right adrenal revealed a pheochromocytoma with capsular invasion, no lymphovascular invasion or extra adrenal extension, negative resection margins  2. 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 ? History of carcinoid tumor proximal transverse colon 4. History of a colon polyp, tubular adenoma- 01/07/2009 5. Neurofibromatosis 6. Prostate cancer, prostatectomy 05/07/2015-Gleason 7, right and left prostate involved, pT2c,pNX 7. 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 Hearing loss      Disposition: Eric Day is in clinical remission from the pheochromocytoma.  He will continue endocrinology follow-up to include repeat  metanephrines.  He is followed by Dr. Leamon Arnt.  The left adrenal and mesenteric mass are being observed.  He is scheduled for repeat imaging including a repeat Dotatate PET scan and brain MRI.  Eric Day will return for a follow-up visit in 1 year.  I am available to see him sooner as needed.  I recommended he follow-up with his primary provider for evaluation of constipation.  He last had a colonoscopy in March 2020.  Betsy Coder, MD  10/25/2019  11:19 AM

## 2019-10-28 ENCOUNTER — Telehealth: Payer: Self-pay | Admitting: Oncology

## 2019-10-28 NOTE — Telephone Encounter (Signed)
Scheduled appts per 7/9 los. Left voicemail with appt date and time.

## 2019-11-02 ENCOUNTER — Ambulatory Visit
Admission: RE | Admit: 2019-11-02 | Discharge: 2019-11-02 | Disposition: A | Discharge status: Home / Self Care | Payer: Medicare Other | Source: Ambulatory Visit | Attending: Nurse Practitioner | Admitting: Nurse Practitioner

## 2019-11-02 DIAGNOSIS — C719 Malignant neoplasm of brain, unspecified: Secondary | ICD-10-CM

## 2019-11-02 DIAGNOSIS — R9389 Abnormal findings on diagnostic imaging of other specified body structures: Secondary | ICD-10-CM

## 2019-11-02 MED ORDER — GADOBENATE DIMEGLUMINE 529 MG/ML IV SOLN
15.0000 mL | Freq: Once | INTRAVENOUS | Status: AC | PRN
  Administered 2019-11-02: 15 mL via INTRAVENOUS

## 2020-04-22 ENCOUNTER — Other Ambulatory Visit: Payer: Self-pay | Admitting: Orthopedic Surgery

## 2020-04-22 DIAGNOSIS — M542 Cervicalgia: Secondary | ICD-10-CM

## 2020-04-23 ENCOUNTER — Other Ambulatory Visit: Payer: Self-pay | Admitting: Orthopedic Surgery

## 2020-04-23 DIAGNOSIS — M542 Cervicalgia: Secondary | ICD-10-CM

## 2020-04-23 DIAGNOSIS — S129XXA Fracture of neck, unspecified, initial encounter: Secondary | ICD-10-CM

## 2020-04-24 ENCOUNTER — Other Ambulatory Visit: Payer: Self-pay

## 2020-04-24 ENCOUNTER — Ambulatory Visit
Admission: RE | Admit: 2020-04-24 | Discharge: 2020-04-24 | Disposition: A | Discharge status: Home / Self Care | Payer: Medicare Other | Source: Ambulatory Visit | Attending: Orthopedic Surgery | Admitting: Orthopedic Surgery

## 2020-04-24 DIAGNOSIS — M47812 Spondylosis without myelopathy or radiculopathy, cervical region: Secondary | ICD-10-CM | POA: Diagnosis not present

## 2020-04-24 DIAGNOSIS — M4803 Spinal stenosis, cervicothoracic region: Secondary | ICD-10-CM | POA: Diagnosis not present

## 2020-04-24 DIAGNOSIS — M4802 Spinal stenosis, cervical region: Secondary | ICD-10-CM | POA: Diagnosis not present

## 2020-04-24 DIAGNOSIS — M542 Cervicalgia: Secondary | ICD-10-CM

## 2020-04-24 DIAGNOSIS — R200 Anesthesia of skin: Secondary | ICD-10-CM | POA: Diagnosis not present

## 2020-05-08 ENCOUNTER — Ambulatory Visit
Admission: RE | Admit: 2020-05-08 | Discharge: 2020-05-08 | Disposition: A | Discharge status: Home / Self Care | Payer: Medicare Other | Source: Ambulatory Visit | Attending: Orthopedic Surgery | Admitting: Orthopedic Surgery

## 2020-05-08 DIAGNOSIS — M2578 Osteophyte, vertebrae: Secondary | ICD-10-CM | POA: Diagnosis not present

## 2020-05-08 DIAGNOSIS — M542 Cervicalgia: Secondary | ICD-10-CM | POA: Diagnosis not present

## 2020-05-08 DIAGNOSIS — S129XXA Fracture of neck, unspecified, initial encounter: Secondary | ICD-10-CM

## 2020-05-08 DIAGNOSIS — Z981 Arthrodesis status: Secondary | ICD-10-CM | POA: Diagnosis not present

## 2020-05-08 DIAGNOSIS — M4322 Fusion of spine, cervical region: Secondary | ICD-10-CM | POA: Diagnosis not present

## 2020-05-11 DIAGNOSIS — M542 Cervicalgia: Secondary | ICD-10-CM | POA: Diagnosis not present

## 2020-05-19 DIAGNOSIS — C7490 Malignant neoplasm of unspecified part of unspecified adrenal gland: Secondary | ICD-10-CM | POA: Diagnosis not present

## 2020-05-19 DIAGNOSIS — E2740 Unspecified adrenocortical insufficiency: Secondary | ICD-10-CM | POA: Diagnosis not present

## 2020-05-20 DIAGNOSIS — H47233 Glaucomatous optic atrophy, bilateral: Secondary | ICD-10-CM | POA: Diagnosis not present

## 2020-06-09 DIAGNOSIS — C7490 Malignant neoplasm of unspecified part of unspecified adrenal gland: Secondary | ICD-10-CM | POA: Diagnosis not present

## 2020-06-09 DIAGNOSIS — E2740 Unspecified adrenocortical insufficiency: Secondary | ICD-10-CM | POA: Diagnosis not present

## 2020-06-17 DIAGNOSIS — Z79899 Other long term (current) drug therapy: Secondary | ICD-10-CM | POA: Diagnosis not present

## 2020-06-17 DIAGNOSIS — M47812 Spondylosis without myelopathy or radiculopathy, cervical region: Secondary | ICD-10-CM | POA: Diagnosis not present

## 2020-06-17 DIAGNOSIS — G894 Chronic pain syndrome: Secondary | ICD-10-CM | POA: Diagnosis not present

## 2020-06-17 DIAGNOSIS — R519 Headache, unspecified: Secondary | ICD-10-CM | POA: Diagnosis not present

## 2020-06-17 DIAGNOSIS — Z79891 Long term (current) use of opiate analgesic: Secondary | ICD-10-CM | POA: Diagnosis not present

## 2020-06-17 DIAGNOSIS — M542 Cervicalgia: Secondary | ICD-10-CM | POA: Diagnosis not present

## 2020-07-06 DIAGNOSIS — M47812 Spondylosis without myelopathy or radiculopathy, cervical region: Secondary | ICD-10-CM | POA: Diagnosis not present

## 2020-07-20 DIAGNOSIS — M47812 Spondylosis without myelopathy or radiculopathy, cervical region: Secondary | ICD-10-CM | POA: Diagnosis not present

## 2020-09-07 DIAGNOSIS — M47812 Spondylosis without myelopathy or radiculopathy, cervical region: Secondary | ICD-10-CM | POA: Diagnosis not present

## 2020-09-15 DIAGNOSIS — H548 Legal blindness, as defined in USA: Secondary | ICD-10-CM | POA: Diagnosis not present

## 2020-09-15 DIAGNOSIS — Z720 Tobacco use: Secondary | ICD-10-CM | POA: Diagnosis not present

## 2020-09-15 DIAGNOSIS — Z85038 Personal history of other malignant neoplasm of large intestine: Secondary | ICD-10-CM | POA: Diagnosis not present

## 2020-09-15 DIAGNOSIS — R7303 Prediabetes: Secondary | ICD-10-CM | POA: Diagnosis not present

## 2020-09-15 DIAGNOSIS — E7800 Pure hypercholesterolemia, unspecified: Secondary | ICD-10-CM | POA: Diagnosis not present

## 2020-09-15 DIAGNOSIS — H9190 Unspecified hearing loss, unspecified ear: Secondary | ICD-10-CM | POA: Diagnosis not present

## 2020-09-15 DIAGNOSIS — Q6689 Other  specified congenital deformities of feet: Secondary | ICD-10-CM | POA: Diagnosis not present

## 2020-09-15 DIAGNOSIS — I10 Essential (primary) hypertension: Secondary | ICD-10-CM | POA: Diagnosis not present

## 2020-09-16 DIAGNOSIS — M47812 Spondylosis without myelopathy or radiculopathy, cervical region: Secondary | ICD-10-CM | POA: Diagnosis not present

## 2020-10-26 ENCOUNTER — Other Ambulatory Visit: Payer: Self-pay

## 2020-10-26 ENCOUNTER — Inpatient Hospital Stay: Payer: Medicare Other | Attending: Oncology | Admitting: Oncology

## 2020-10-26 VITALS — BP 111/71 | HR 73 | Temp 98.1°F | Resp 18 | Ht 66.0 in | Wt 186.0 lb

## 2020-10-26 DIAGNOSIS — Q8500 Neurofibromatosis, unspecified: Secondary | ICD-10-CM | POA: Insufficient documentation

## 2020-10-26 DIAGNOSIS — Z86010 Personal history of colonic polyps: Secondary | ICD-10-CM | POA: Diagnosis not present

## 2020-10-26 DIAGNOSIS — Z8546 Personal history of malignant neoplasm of prostate: Secondary | ICD-10-CM | POA: Insufficient documentation

## 2020-10-26 DIAGNOSIS — Z903 Acquired absence of stomach [part of]: Secondary | ICD-10-CM | POA: Diagnosis not present

## 2020-10-26 DIAGNOSIS — C7A00 Malignant carcinoid tumor of unspecified site: Secondary | ICD-10-CM

## 2020-10-26 DIAGNOSIS — Z8719 Personal history of other diseases of the digestive system: Secondary | ICD-10-CM | POA: Diagnosis not present

## 2020-10-26 DIAGNOSIS — H9190 Unspecified hearing loss, unspecified ear: Secondary | ICD-10-CM | POA: Insufficient documentation

## 2020-10-26 DIAGNOSIS — D3501 Benign neoplasm of right adrenal gland: Secondary | ICD-10-CM | POA: Insufficient documentation

## 2020-10-26 DIAGNOSIS — Z79899 Other long term (current) drug therapy: Secondary | ICD-10-CM | POA: Diagnosis not present

## 2020-10-26 NOTE — Progress Notes (Signed)
[place] OFFICE PROGRESS NOTE   Diagnosis: Pheochromocytoma, neurofibromatosis  INTERVAL HISTORY:   Mr. Eric Day returns as scheduled.  He is here with a caretaker.  No fever, night sweats, headache, or tachycardia.  He has intermittent discomfort in the right lateral abdomen.  No consistent pain.  He is working.  Objective:  Vital signs in last 24 hours:  Blood pressure 111/71, pulse 73, temperature 98.1 F (36.7 C), temperature source Oral, resp. rate 18, height 5\' 6"  (1.676 m), weight 186 lb (84.4 kg), SpO2 97 %.    Lymphatics: No cervical, supraclavicular, axillary, or inguinal nodes Resp: Lungs clear bilaterally, distant breath sounds Cardio: Regular rate and rhythm GI: No mass, no hepatosplenomegaly, mild tenderness in the lateral right abdomen near the costal margin, no mass Vascular: No leg edema  Skin: Multiple neurofibromas  Lab Results:  Lab Results  Component Value Date   WBC 9.1 05/26/2018   HGB 14.2 05/26/2018   HCT 46.3 05/26/2018   MCV 86.5 05/26/2018   PLT 314 05/26/2018   NEUTROABS 4.1 06/28/2014    CMP  Lab Results  Component Value Date   NA 139 05/26/2018   K 4.3 05/26/2018   CL 105 05/26/2018   CO2 27 05/26/2018   GLUCOSE 103 (H) 05/26/2018   BUN 7 05/26/2018   CREATININE 0.85 05/26/2018   CALCIUM 9.5 05/26/2018   PROT 6.3 (L) 05/26/2018   ALBUMIN 3.9 05/26/2018   AST 14 (L) 05/26/2018   ALT 22 05/26/2018   ALKPHOS 75 05/26/2018   BILITOT 1.1 05/26/2018   GFRNONAA >60 05/26/2018   GFRAA >60 05/26/2018    No results found for: CEA1, CEA, FTD322, CA125    Medications: I have reviewed the patient's current medications.   Assessment/Plan: Right adrenal mass, rectal wall thickening noted on CT 05/26/2018 PET scan 06/26/2018- (compared to PET scan 10/12/2004 ) moderate to marked increased uptake associated with enlarging left adrenal nodule.  New nodule within the right adrenal bed status post adrenal gland resection.  No  significant radiotracer uptake localizing to the rectum.  Scattered soft tissue nodules identified within the skin and subcutaneous soft tissues which exhibit mild increased uptake.  Mild nonspecific uptake associated with recently described new 12 mm mesenteric lymph node. Elevated chromogranin A level on 07/04/2018 Elevated 24-hour urine metanephrines, no metanephrines, and epinephrine levels on 08/03/2018 Colonoscopy 07/02/2018- negative Elevated plasma metanephrines and [facility] on 11/30/2018 PET Dotatate scan at [facility] on 12/13/2018-bilateral increased radiotracer uptake in the adrenal nodules, radiotracer activity in a segment of small bowel in the pelvis and mesenteric soft tissue mass, mild increased radiotracer activity at L3 Right adrenalectomy and cholecystectomy 05/14/2019, pathology from the right adrenal revealed a pheochromocytoma with capsular invasion, no lymphovascular invasion or extra adrenal extension, negative resection margins Dotatate  PET 12/10/2019 at [facility] left adrenal nodule, stable mesenteric soft tissue mass, stable radiotracer activity associated with a segment of small bowel in the pelvis, unchanged mild tracer activity at L3, no evidence of new tracer avid disease   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  Geisler appears stable.  There is no clinical evidence for progression of the pheochromocytoma or neuroendocrine tumor.  He is scheduled for a  follow-up visit and imaging with Dr. Leamon Arnt next month.  He continues follow-up with endocrinology at [facility].  Mr. Almquist will return for an office visit in 1 year.  Betsy Coder, MD  10/26/2020  12:00 PM

## 2020-10-28 DIAGNOSIS — Z981 Arthrodesis status: Secondary | ICD-10-CM | POA: Diagnosis not present

## 2020-10-28 DIAGNOSIS — G894 Chronic pain syndrome: Secondary | ICD-10-CM | POA: Diagnosis not present

## 2020-10-28 DIAGNOSIS — M47812 Spondylosis without myelopathy or radiculopathy, cervical region: Secondary | ICD-10-CM | POA: Diagnosis not present

## 2020-10-28 DIAGNOSIS — M542 Cervicalgia: Secondary | ICD-10-CM | POA: Diagnosis not present

## 2020-10-28 DIAGNOSIS — R519 Headache, unspecified: Secondary | ICD-10-CM | POA: Diagnosis not present

## 2020-12-07 DIAGNOSIS — R1030 Lower abdominal pain, unspecified: Secondary | ICD-10-CM | POA: Diagnosis not present

## 2020-12-07 DIAGNOSIS — R935 Abnormal findings on diagnostic imaging of other abdominal regions, including retroperitoneum: Secondary | ICD-10-CM | POA: Diagnosis not present

## 2020-12-08 DIAGNOSIS — C7230 Malignant neoplasm of unspecified optic nerve: Secondary | ICD-10-CM | POA: Diagnosis not present

## 2020-12-08 DIAGNOSIS — D3501 Benign neoplasm of right adrenal gland: Secondary | ICD-10-CM | POA: Diagnosis not present

## 2020-12-08 DIAGNOSIS — C7A023 Malignant carcinoid tumor of the transverse colon: Secondary | ICD-10-CM | POA: Diagnosis not present

## 2020-12-08 DIAGNOSIS — C7B8 Other secondary neuroendocrine tumors: Secondary | ICD-10-CM | POA: Diagnosis not present

## 2020-12-08 DIAGNOSIS — D3502 Benign neoplasm of left adrenal gland: Secondary | ICD-10-CM | POA: Diagnosis not present

## 2020-12-08 DIAGNOSIS — C7490 Malignant neoplasm of unspecified part of unspecified adrenal gland: Secondary | ICD-10-CM | POA: Diagnosis not present

## 2020-12-15 DIAGNOSIS — C7490 Malignant neoplasm of unspecified part of unspecified adrenal gland: Secondary | ICD-10-CM | POA: Diagnosis not present

## 2020-12-15 DIAGNOSIS — E2740 Unspecified adrenocortical insufficiency: Secondary | ICD-10-CM | POA: Diagnosis not present

## 2021-02-10 DIAGNOSIS — Z23 Encounter for immunization: Secondary | ICD-10-CM | POA: Diagnosis not present

## 2021-02-17 DIAGNOSIS — E2740 Unspecified adrenocortical insufficiency: Secondary | ICD-10-CM | POA: Diagnosis not present

## 2021-05-06 DIAGNOSIS — Z85038 Personal history of other malignant neoplasm of large intestine: Secondary | ICD-10-CM | POA: Diagnosis not present

## 2021-05-06 DIAGNOSIS — E7800 Pure hypercholesterolemia, unspecified: Secondary | ICD-10-CM | POA: Diagnosis not present

## 2021-05-06 DIAGNOSIS — R7303 Prediabetes: Secondary | ICD-10-CM | POA: Diagnosis not present

## 2021-05-06 DIAGNOSIS — Z0000 Encounter for general adult medical examination without abnormal findings: Secondary | ICD-10-CM | POA: Diagnosis not present

## 2021-05-06 DIAGNOSIS — Z1389 Encounter for screening for other disorder: Secondary | ICD-10-CM | POA: Diagnosis not present

## 2021-05-06 DIAGNOSIS — I10 Essential (primary) hypertension: Secondary | ICD-10-CM | POA: Diagnosis not present

## 2021-05-06 DIAGNOSIS — H548 Legal blindness, as defined in USA: Secondary | ICD-10-CM | POA: Diagnosis not present

## 2021-05-06 DIAGNOSIS — Z720 Tobacco use: Secondary | ICD-10-CM | POA: Diagnosis not present

## 2021-05-06 DIAGNOSIS — H9190 Unspecified hearing loss, unspecified ear: Secondary | ICD-10-CM | POA: Diagnosis not present

## 2021-06-22 DIAGNOSIS — E559 Vitamin D deficiency, unspecified: Secondary | ICD-10-CM | POA: Diagnosis not present

## 2021-06-22 DIAGNOSIS — E2740 Unspecified adrenocortical insufficiency: Secondary | ICD-10-CM | POA: Diagnosis not present

## 2021-09-30 DIAGNOSIS — E559 Vitamin D deficiency, unspecified: Secondary | ICD-10-CM | POA: Diagnosis not present

## 2021-10-26 ENCOUNTER — Telehealth: Payer: Self-pay

## 2021-10-26 ENCOUNTER — Inpatient Hospital Stay: Payer: Medicare Other | Attending: Oncology | Admitting: Oncology

## 2021-10-26 NOTE — Telephone Encounter (Signed)
TC to Pt's Friend(caretaker) Blossom Hoops to inform of missed appointment. Pt's caregiver stated she didn't get the call to confirm but maybe his other caretaker got the call and mixed up the date. She will return call to reschedule appointment.

## 2021-11-09 DIAGNOSIS — R001 Bradycardia, unspecified: Secondary | ICD-10-CM | POA: Diagnosis not present

## 2021-11-09 DIAGNOSIS — E7800 Pure hypercholesterolemia, unspecified: Secondary | ICD-10-CM | POA: Diagnosis not present

## 2021-11-09 DIAGNOSIS — M545 Low back pain, unspecified: Secondary | ICD-10-CM | POA: Diagnosis not present

## 2021-11-09 DIAGNOSIS — R9431 Abnormal electrocardiogram [ECG] [EKG]: Secondary | ICD-10-CM | POA: Diagnosis not present

## 2021-11-09 DIAGNOSIS — H9190 Unspecified hearing loss, unspecified ear: Secondary | ICD-10-CM | POA: Diagnosis not present

## 2021-11-09 DIAGNOSIS — H548 Legal blindness, as defined in USA: Secondary | ICD-10-CM | POA: Diagnosis not present

## 2021-11-09 DIAGNOSIS — I10 Essential (primary) hypertension: Secondary | ICD-10-CM | POA: Diagnosis not present

## 2021-11-09 DIAGNOSIS — R7303 Prediabetes: Secondary | ICD-10-CM | POA: Diagnosis not present

## 2021-11-30 DIAGNOSIS — H47233 Glaucomatous optic atrophy, bilateral: Secondary | ICD-10-CM | POA: Diagnosis not present

## 2021-12-08 DIAGNOSIS — Z4420 Encounter for fitting and adjustment of artificial eye, unspecified: Secondary | ICD-10-CM | POA: Diagnosis not present

## 2021-12-09 ENCOUNTER — Telehealth: Payer: Self-pay | Admitting: *Deleted

## 2021-12-09 NOTE — Telephone Encounter (Signed)
Left VM for caregiver to call office to reschedule his missed 1 year follow up with Dr. Benay Spice.

## 2021-12-13 DIAGNOSIS — C7B8 Other secondary neuroendocrine tumors: Secondary | ICD-10-CM | POA: Diagnosis not present

## 2021-12-13 DIAGNOSIS — Z85030 Personal history of malignant carcinoid tumor of large intestine: Secondary | ICD-10-CM | POA: Diagnosis not present

## 2021-12-13 DIAGNOSIS — C7A023 Malignant carcinoid tumor of the transverse colon: Secondary | ICD-10-CM | POA: Diagnosis not present

## 2021-12-13 DIAGNOSIS — D3502 Benign neoplasm of left adrenal gland: Secondary | ICD-10-CM | POA: Diagnosis not present

## 2021-12-13 DIAGNOSIS — Z85028 Personal history of other malignant neoplasm of stomach: Secondary | ICD-10-CM | POA: Diagnosis not present

## 2021-12-13 DIAGNOSIS — F17210 Nicotine dependence, cigarettes, uncomplicated: Secondary | ICD-10-CM | POA: Diagnosis not present

## 2021-12-13 DIAGNOSIS — I252 Old myocardial infarction: Secondary | ICD-10-CM | POA: Diagnosis not present

## 2021-12-13 DIAGNOSIS — I10 Essential (primary) hypertension: Secondary | ICD-10-CM | POA: Diagnosis not present

## 2021-12-13 DIAGNOSIS — E559 Vitamin D deficiency, unspecified: Secondary | ICD-10-CM | POA: Diagnosis not present

## 2021-12-13 DIAGNOSIS — M7989 Other specified soft tissue disorders: Secondary | ICD-10-CM | POA: Diagnosis not present

## 2021-12-13 DIAGNOSIS — D447 Neoplasm of uncertain behavior of aortic body and other paraganglia: Secondary | ICD-10-CM | POA: Diagnosis not present

## 2021-12-13 DIAGNOSIS — Z9089 Acquired absence of other organs: Secondary | ICD-10-CM | POA: Diagnosis not present

## 2021-12-13 DIAGNOSIS — F32A Depression, unspecified: Secondary | ICD-10-CM | POA: Diagnosis not present

## 2021-12-13 DIAGNOSIS — R1011 Right upper quadrant pain: Secondary | ICD-10-CM | POA: Diagnosis not present

## 2021-12-13 DIAGNOSIS — C7230 Malignant neoplasm of unspecified optic nerve: Secondary | ICD-10-CM | POA: Diagnosis not present

## 2021-12-13 DIAGNOSIS — C7490 Malignant neoplasm of unspecified part of unspecified adrenal gland: Secondary | ICD-10-CM | POA: Diagnosis not present

## 2021-12-17 DIAGNOSIS — M5451 Vertebrogenic low back pain: Secondary | ICD-10-CM | POA: Diagnosis not present

## 2021-12-23 ENCOUNTER — Ambulatory Visit: Payer: Medicare Other | Attending: Cardiovascular Disease | Admitting: Cardiovascular Disease

## 2021-12-23 ENCOUNTER — Encounter: Payer: Self-pay | Admitting: Cardiovascular Disease

## 2021-12-23 VITALS — BP 120/66 | HR 53 | Ht 65.0 in | Wt 189.4 lb

## 2021-12-23 DIAGNOSIS — I4510 Unspecified right bundle-branch block: Secondary | ICD-10-CM

## 2021-12-23 DIAGNOSIS — I252 Old myocardial infarction: Secondary | ICD-10-CM | POA: Diagnosis not present

## 2021-12-23 DIAGNOSIS — I493 Ventricular premature depolarization: Secondary | ICD-10-CM | POA: Diagnosis not present

## 2021-12-23 DIAGNOSIS — I10 Essential (primary) hypertension: Secondary | ICD-10-CM | POA: Diagnosis not present

## 2021-12-23 NOTE — Patient Instructions (Signed)
Medication Instructions:  Your physician recommends that you continue on your current medications as directed. Please refer to the Current Medication list given to you today.  *If you need a refill on your cardiac medications before your next appointment, please call your pharmacy*   Lab Work: NONE If you have labs (blood work) drawn today and your tests are completely normal, you will receive your results only by: [place] (if you have MyChart) OR A paper copy in the mail If you have any lab test that is abnormal or we need to change your treatment, we will call you to review the results.   Testing/Procedures: ECHO Your physician has requested that you have an echocardiogram. Echocardiography is a painless test that uses sound waves to create images of your heart. It provides your doctor with information about the size and shape of your heart and how well your heart's chambers and valves are working. This procedure takes approximately one hour. There are no restrictions for this procedure.  [place] Test Your physician has requested that you have a lexiscan myoview. For further information please visit [email or web address]. Please follow instruction sheet, as given.  Follow-Up: At [facility], you and your health needs are our priority.  As part of our continuing mission to provide you with exceptional heart care, we have created designated Provider Care Teams.  These Care Teams include your primary Cardiologist (physician) and Advanced Practice Providers (APPs -  Physician Assistants and Nurse Practitioners) who all work together to provide you with the care you need, when you need it.  Your next appointment:   As Needed  The format for your next appointment:   In Person  Provider:   Mertie Moores, MD   Other Instructions See Separate sheet for stress test instructions  Important Information About Sugar

## 2021-12-23 NOTE — Progress Notes (Signed)
Cardiology Office Note:    Date:  12/23/2021   ID:  Eric Day, DOB 12-21-1960, MRN [id number]  PCP:  Antony Contras, MD   [place] Providers Cardiologist:  Haralambos Yeatts     Referring MD: Antony Contras, MD   Chief Complaint  Patient presents with   Abnormal ECG    History of Present Illness:     Seen with Eric Day, peer support specialist   Eric Day is a 61 y.o. male with a hx of "cardiac event" Had a cath (   Dr. Jerrye Beavers) in 2006.  He was found to have normal coronary arteries at that time.  It was thought that the myocardial infarction was due to hypertensive non-ST segment elevation myocardial infarction.  Still smokes   Neurofibromatosis   He has had an adrenal tumor removed.  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  He is followed at [facility].  He still secretes excess metanephrines.  Lipid levels on the K PN reveal a total cholesterol of 179 The HDL is 34 LDL is 132 Triglyceride level is 66       Past Medical History:  Diagnosis Date   Arthritis    Colon cancer ([place])    prostate; history of stomach cancer    Colon cancer ([place])    Depression    ED (erectile dysfunction)    Eye disorder    rt artificial   Hard of hearing    bilat    Hypertension    Legally blind    Multiple falls    Myocardial infarction [facility])    2008   Neurofibromatosis ([place])    Nocturia    Prosthetic eye globe    right eye    Shortness of breath dyspnea    walking distances or running    Tuberculosis    70's   Wears glasses     Past Surgical History:  Procedure Laterality Date   ABDOMINAL SURGERY  90's   stomach?   ANTERIOR CERVICAL DECOMP/DISCECTOMY FUSION N/A 07/07/2014   Procedure: ANTERIOR CERVICAL DECOMPRESSION/DISCECTOMY FUSION CERVICAL THREE-FOUR;  Surgeon: Kary Kos, MD;  Location: [facility];  Service: Neurosurgery;  Laterality: N/A;   CARDIAC CATHETERIZATION     EYE SURGERY  90's   HERNIA REPAIR     hernia as child   ROBOT  ASSISTED LAPAROSCOPIC RADICAL PROSTATECTOMY N/A 05/07/2015   Procedure: ROBOTIC ASSISTED LAPAROSCOPIC RADICAL PROSTATECTOMY  LEVEL 3 LYSIS OF ADHESIONS/SMALL BOWEL RESECTION AND ANASTAMOSIS;  Surgeon: Raynelle Bring, MD;  Location: [facility];  Service: Urology;  Laterality: N/A;    Current Medications: Current Meds  Medication Sig   amLODipine (NORVASC) 5 MG tablet Take 5 mg by mouth daily.   carvedilol (COREG) 6.25 MG tablet Take 6.25 mg by mouth 2 (two) times daily.   hydrocortisone (CORTEF) 5 MG tablet Take 5 mg by mouth 2 (two) times daily.   MYRBETRIQ 50 MG TB24 tablet Take 50 mg by mouth daily.     Allergies:   Patient has no known allergies.   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  ROS:   Please see the history of present illness.     All other systems reviewed and are negative.  EKGs/Labs/Other Studies Reviewed:    The following studies were  reviewed today:   EKG:   November 09, 2021: Normal sinus rhythm at 68 bpm.  Right bundle branch block.  Occasional premature ventricular contractions.  Possible previous inferior wall myocardial infarction.  Recent Labs: No results found for requested labs within last 365 days.  Recent Lipid Panel No results found for: "CHOL", "TRIG", "HDL", "CHOLHDL", "VLDL", "LDLCALC", "LDLDIRECT"   Risk Assessment/Calculations:                Physical Exam:    VS:  BP 120/66   Pulse (!) 53   Ht '5\' 5"'$  (1.651 m)   Wt 189 lb 6.4 oz (85.9 kg)   SpO2 97%   BMI 31.52 kg/m     Wt Readings from Last 3 Encounters:  12/23/21 189 lb 6.4 oz (85.9 kg)  10/26/20 186 lb (84.4 kg)  10/25/19 168 lb 3.2 oz (76.3 kg)     GEN:   Chronicallyill appearing , middle age man,  in no acute distress HEENT: Normal, blind in 1 eye  NECK: No JVD; No carotid bruits LYMPHATICS: No lymphadenopathy CARDIAC: RRR,  ,  frequent premature beats  RESPIRATORY:  rales in both bases  ABDOMEN: Soft, non-tender, non-distended MUSCULOSKELETAL:  No edema; No deformity  SKIN: neurofibroma on back , fact, trunk  NEUROLOGIC:  Alert and oriented x 3 PSYCHIATRIC:  Normal affect   ASSESSMENT:    1. Primary hypertension   2. Old myocardial infarction   3. RBBB   4. PVC's (premature ventricular contractions)    PLAN:    In order of problems listed above:  Old inferior wall myocardial infarction: Patient had what was called a coronary event in 2006.  Heart catheterization at that time did not reveal any significant coronary artery disease and it was thought that the myocardial infarction was due to hypertensive emergency.  He has had inferior Q waves noted on his EKG going back to 2016.  At this point he is not having any angina.  We will do a [place] study for further evaluation of his right bundle branch block but I would not expect to find much in the way of ischemia.  I suspect that we will find changes  consistent with a previous inferior wall myocardial infarction.  I have advised him to stop smoking.  He also needs to greatly reduce his intake of fatty foods and carbohydrates.  2.  Right bundle branch block.  We will get a [place]  Myoview study and an echocardiogram for further evaluation of his LV function and to evaluate him for the possibility of ischemic heart disease.  3.  Hypertension: He eats a lot of salt and salty foods.  Advised him to work on salt restriction.  Pheochromocytoma: He is status post removal of 1 adrenal gland.  He still has some residual pheochromocytoma/metanephrine production.  He is followed at [facility].     Shared Decision Making/Informed Consent{ arrhythmias, dizziness, blood pressure fluctuations, myocardial infarction, stroke/transient ischemic attack, nausea, vomiting, allergic reaction, radiation exposure, metallic taste sensation and life-threatening complications (estimated to be 1 in 10,000)], benefits (risk stratification, diagnosing coronary artery disease, treatment guidance) and alternatives of a nuclear stress test were discussed in detail with Eric Day and he agrees to proceed.    Medication Adjustments/Labs and Tests Ordered: Current medicines are reviewed at length with the patient today.  Concerns regarding medicines are outlined above.  Orders Placed This Encounter  Procedures   MYOCARDIAL PERFUSION IMAGING   ECHOCARDIOGRAM COMPLETE   No orders of the defined types were placed in this encounter.    Patient Instructions  Medication Instructions:  Your physician recommends that you continue on your current medications as directed. Please refer to the Current Medication list given to you today.  *If you need a refill on your cardiac medications before your next appointment, please call your pharmacy*   Lab Work: NONE If you have labs (blood work) drawn today and your tests are completely normal, you will receive your results only  by: [place] (if you have MyChart) OR A paper copy in the mail If you have any lab test that is abnormal or we need to change your treatment, we will call you to review the results.   Testing/Procedures: ECHO Your physician has requested that you have an echocardiogram. Echocardiography is a painless test that uses sound waves to create images of your heart. It provides your doctor with information about the size and shape of your heart and how well your heart's chambers and valves are working. This procedure takes approximately one hour. There are no restrictions for this procedure.  [place] Test Your physician has requested that you have a lexiscan myoview. For further information please visit [email or web address]. Please follow instruction sheet, as given.  Follow-Up: At [facility], you and your health needs are our priority.  As part of our continuing mission to provide you with exceptional heart care, we have created designated Provider Care Teams.  These Care Teams include your primary Cardiologist (physician) and Advanced Practice Providers (APPs -  Physician Assistants and Nurse Practitioners) who all work together to provide you with the care you need, when you need it.  Your next appointment:   As Needed  The format for your next appointment:   In Person  Provider:   Mertie Moores, MD   Other Instructions See Separate sheet for stress test instructions  Important Information About Sugar         Signed, Mertie Moores, MD  12/23/2021 6:23 PM    [place]

## 2021-12-28 ENCOUNTER — Telehealth (HOSPITAL_COMMUNITY): Payer: Self-pay | Admitting: *Deleted

## 2021-12-28 DIAGNOSIS — E559 Vitamin D deficiency, unspecified: Secondary | ICD-10-CM | POA: Diagnosis not present

## 2021-12-28 DIAGNOSIS — C7490 Malignant neoplasm of unspecified part of unspecified adrenal gland: Secondary | ICD-10-CM | POA: Diagnosis not present

## 2021-12-28 DIAGNOSIS — E2740 Unspecified adrenocortical insufficiency: Secondary | ICD-10-CM | POA: Diagnosis not present

## 2021-12-28 NOTE — Telephone Encounter (Signed)
Patient's case worker Carrolyn Meiers was given detailed instructions per Myocardial Perfusion Study Information Sheet for the test on 01/05/22 at 0715. Patient notified to arrive 15 minutes early and that it is imperative to arrive on time for appointment to keep from having the test rescheduled.  If you need to cancel or reschedule your appointment, please call the office within 24 hours of your appointment. . Patient verbalized understanding.Alcides Nutting, Ranae Palms

## 2022-01-05 ENCOUNTER — Ambulatory Visit (HOSPITAL_BASED_OUTPATIENT_CLINIC_OR_DEPARTMENT_OTHER): Payer: Medicare Other

## 2022-01-05 ENCOUNTER — Encounter: Payer: Self-pay | Admitting: Cardiovascular Disease

## 2022-01-05 ENCOUNTER — Ambulatory Visit (HOSPITAL_COMMUNITY): Payer: Medicare Other | Attending: Cardiovascular Disease

## 2022-01-05 DIAGNOSIS — I4510 Unspecified right bundle-branch block: Secondary | ICD-10-CM | POA: Insufficient documentation

## 2022-01-05 DIAGNOSIS — I252 Old myocardial infarction: Secondary | ICD-10-CM | POA: Diagnosis not present

## 2022-01-05 DIAGNOSIS — I10 Essential (primary) hypertension: Secondary | ICD-10-CM | POA: Diagnosis not present

## 2022-01-05 LAB — ECHOCARDIOGRAM COMPLETE
Area-P 1/2: 3.74 cm2
Height: 65 in
S' Lateral: 2.9 cm
Weight: 3024 oz

## 2022-01-05 LAB — MYOCARDIAL PERFUSION IMAGING
LV dias vol: 68 mL (ref 62–150)
LV sys vol: 37 mL
Nuc Stress EF: 45 %
Peak HR: 105 {beats}/min
Rest HR: 86 {beats}/min
Rest Nuclear Isotope Dose: 10.7 mCi
SDS: 4
SRS: 4
SSS: 8
ST Depression (mm): 0 mm
Stress Nuclear Isotope Dose: 30.7 mCi
TID: 0.98

## 2022-01-05 MED ORDER — TECHNETIUM TC 99M TETROFOSMIN IV KIT
10.7000 | PACK | Freq: Once | INTRAVENOUS | Status: AC | PRN
  Administered 2022-01-05: 10.7 via INTRAVENOUS

## 2022-01-05 MED ORDER — TECHNETIUM TC 99M TETROFOSMIN IV KIT
30.7000 | PACK | Freq: Once | INTRAVENOUS | Status: AC | PRN
  Administered 2022-01-05: 30.7 via INTRAVENOUS

## 2022-01-05 MED ORDER — REGADENOSON 0.4 MG/5ML IV SOLN
0.4000 mg | Freq: Once | INTRAVENOUS | Status: AC
  Administered 2022-01-05: 0.4 mg via INTRAVENOUS

## 2022-01-07 ENCOUNTER — Telehealth: Payer: Self-pay

## 2022-01-07 DIAGNOSIS — Z79899 Other long term (current) drug therapy: Secondary | ICD-10-CM

## 2022-01-07 MED ORDER — VALSARTAN 80 MG PO TABS: 80.0000 mg | ORAL_TABLET | Freq: Every day | ORAL | 3 refills | Status: DC

## 2022-01-07 NOTE — Telephone Encounter (Signed)
-----   Message from Thayer Headings, MD sent at 01/06/2022  6:45 PM EDT ----- Myoview shows a previous Inf. MI.  This was known form 2012. No current cp  DC amlodipine Start valsartan 80 mg a day  BMP in 3 weeks . Keep follow appt.

## 2022-01-07 NOTE — Telephone Encounter (Signed)
Called and spoke with friend [name] (listed on DPR) who fully understands directions of stopping Amlodipine, starting Valsartan and repeat BMET in 3 weeks. All orders placed and labs scheduled for [place], 01/28/22. She will ensure pt knows all above information.

## 2022-01-28 ENCOUNTER — Ambulatory Visit: Payer: Medicare Other | Attending: Cardiovascular Disease

## 2022-01-28 DIAGNOSIS — Z79899 Other long term (current) drug therapy: Secondary | ICD-10-CM | POA: Diagnosis not present

## 2022-01-29 LAB — BASIC METABOLIC PANEL
BUN/Creatinine Ratio: 15 (ref 10–24)
BUN: 15 mg/dL (ref 8–27)
CO2: 23 mmol/L (ref 20–29)
Calcium: 10.5 mg/dL — ABNORMAL HIGH (ref 8.6–10.2)
Chloride: 102 mmol/L (ref 96–106)
Creatinine, Ser: 1.01 mg/dL (ref 0.76–1.27)
Glucose: 77 mg/dL (ref 70–99)
Potassium: 4.8 mmol/L (ref 3.5–5.2)
Sodium: 138 mmol/L (ref 134–144)
eGFR: 84 mL/min/{1.73_m2} (ref >59)

## 2022-02-28 DIAGNOSIS — H4720 Unspecified optic atrophy: Secondary | ICD-10-CM | POA: Diagnosis not present

## 2022-04-27 ENCOUNTER — Ambulatory Visit: Payer: Medicare Other | Admitting: Nurse Practitioner

## 2022-05-16 ENCOUNTER — Ambulatory Visit: Payer: 59 | Admitting: Physician Assistant

## 2022-05-16 ENCOUNTER — Encounter: Payer: Self-pay | Admitting: Cardiovascular Disease

## 2022-05-24 NOTE — Progress Notes (Unsigned)
Office Visit    Patient Name: Eric Day Date of Encounter: 05/25/2022  PCP:  Antony Contras, MD   [facility]  Cardiologist:  Mertie Moores, MD  Advanced Practice Provider:  No care team member to display Electrophysiologist:  None   HPI    Eric Day is a 62 y.o. male with a history of "cardiac event" consisting of catheterization in 2006 and found to have normal coronaries at that time but hypertensive NSTEMI was suggested, tobacco abuse, neurofibromatosis, removal of adrenal tumor (still has some degree of pheochromocytoma and followed at [facility]), hyperlipidemia presents today for follow-up appointment.  Was last seen 12/2021 and at that time he was not having any angina.  A Lexiscan Myoview was ordered for further evaluation of his right bundle branch block.  Today, he states he is not having any chest pain.  He does have some chest tightness occasionally when laying down and watching TV.  Nonexertional.  He usually takes Tylenol and it resolves.  Nothing makes it worse.  Usually last for a few minutes.  He does occasionally have shortness of breath but he smokes cigarettes.  He has cut back a lot. 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  We discussed starting him back on his Coreg at a low dose.  He is having frequent PVCs today on EKG.   No edema, orthopnea, PND. Reports no palpitations.   Past Medical History    Past Medical History:  Diagnosis Date   Arthritis    Colon cancer ([place])    prostate; history of stomach cancer    Colon cancer ([place])    Depression    ED (erectile dysfunction)    Eye disorder    rt artificial   Hard of hearing    bilat    Hypertension    Legally blind    Multiple falls    Myocardial infarction [facility])    2008   Neurofibromatosis ([place])    Nocturia    Prosthetic eye globe    right eye    Shortness of breath dyspnea    walking distances or running    Tuberculosis    70's   Wears glasses     Past Surgical History:  Procedure Laterality Date   ABDOMINAL SURGERY  90's   stomach?   ANTERIOR CERVICAL DECOMP/DISCECTOMY FUSION N/A 07/07/2014   Procedure: ANTERIOR CERVICAL DECOMPRESSION/DISCECTOMY FUSION CERVICAL THREE-FOUR;  Surgeon: Kary Kos, MD;  Location: [facility];  Service: Neurosurgery;  Laterality: N/A;   CARDIAC CATHETERIZATION     EYE SURGERY  90's   HERNIA REPAIR     hernia as child   ROBOT ASSISTED LAPAROSCOPIC RADICAL PROSTATECTOMY N/A 05/07/2015   Procedure: ROBOTIC ASSISTED LAPAROSCOPIC RADICAL PROSTATECTOMY  LEVEL 3 LYSIS OF ADHESIONS/SMALL BOWEL RESECTION AND ANASTAMOSIS;  Surgeon: Raynelle Bring, MD;  Location: [facility];  Service: Urology;  Laterality: N/A;    Allergies  No Known Allergies   EKGs/Labs/Other Studies Reviewed:   The following studies were reviewed today:  Lexiscan Myoview 01/05/2022  Study Highlights      Findings are consistent with prior myocardial infarction with peri-infarct ischemia. The study is intermediate risk.   No ST deviation was noted.   LV perfusion is abnormal. There is evidence of ischemia. There is evidence of infarction. Defect 1: There is a medium defect with mild reduction in uptake present in the apical to basal inferior and inferolateral location(s) that is partially reversible. There  is abnormal wall motion in the defect area. Consistent with infarction and peri-infarct ischemia.   Left ventricular function is abnormal. Nuclear stress EF: 45 %. The left ventricular ejection fraction is mildly decreased (45-54%). End diastolic cavity size is normal. End systolic cavity size is normal.   Prior study not available for comparison.   Partially reversible basal to apical inferior/inferolateral perfusion defect with abnormal wall motion, consistent with infarct with peri-infarct ischemia Mild systolic dysfunction, EF 42% Intermediate risk study   EKG:  EKG is  ordered today.  The ekg ordered today demonstrates normal  sinus rhythm with frequent PVCs, 93 bpm, right bundle branch block (old), biatrial enlargement  Recent Labs: 01/28/2022: BUN 15; Creatinine, Ser 1.01; Potassium 4.8; Sodium 138  Recent Lipid Panel No results found for: "CHOL", "TRIG", "HDL", "CHOLHDL", "VLDL", "LDLCALC", "LDLDIRECT"   Home Medications   Current Meds  Medication Sig   carvedilol (COREG) 3.125 MG tablet Take 1 tablet (3.125 mg total) by mouth 2 (two) times daily with a meal.   hydrocortisone (CORTEF) 5 MG tablet Take 5 mg by mouth 2 (two) times daily.   MYRBETRIQ 50 MG TB24 tablet Take 50 mg by mouth daily.   valsartan (DIOVAN) 80 MG tablet Take 1 tablet (80 mg total) by mouth daily.     Review of Systems      All other systems reviewed and are otherwise negative except as noted above.  Physical Exam    VS:  BP 122/60   Pulse 93   Ht '5\' 5"'$  (1.651 m)   Wt 193 lb 3.2 oz (87.6 kg)   SpO2 95%   BMI 32.15 kg/m  , BMI Body mass index is 32.15 kg/m.  Wt Readings from Last 3 Encounters:  05/25/22 193 lb 3.2 oz (87.6 kg)  01/05/22 189 lb (85.7 kg)  12/23/21 189 lb 6.4 oz (85.9 kg)     GEN: Well nourished, well developed, in no acute distress. HEENT: normal. Neck: Supple, no JVD, carotid bruits, or masses. Cardiac: irregular (likely frequent PVCs), no murmurs, rubs, or gallops. No clubbing, cyanosis, edema.  Radials/PT 2+ and equal bilaterally.  Respiratory:  Respirations regular and unlabored, clear to auscultation bilaterally. GI: Soft, nontender, nondistended. MS: No deformity or atrophy. Skin: Warm and dry, no rash. Neuro:  Strength and sensation are intact. Psych: Normal affect.  Assessment & Plan    Old inferior wall myocardial infarction -some chest tightness at times, not exertional -discussed echo and stress test from Sept -adding back coreg 3.125 today for frequent PVCs -will defer ischemic workup today but encouraged patient to let us know if tightness continues -offered Imdur but deferred at  this time  Right bundle branch block -chronic -ischemic workup back in Sept -continue current medication regimen  Hypertension -well controlled today -continue Valsartan '80mg'$  -adding coreg 3.'125mg'$  BID -encouraged to track HR and BP daily  Hyperlipidemia -LDL 132 -will be due for repeat lipid panel in the summer -goal LDL < 70  Pheochromocytoma -followed at [facility] -removal of 1 adrenal gland        Disposition: Follow up 3 months with Mertie Moores, MD or APP.  Signed, Elgie Collard, PA-C 05/25/2022, 5:20 PM [facility]

## 2022-05-25 ENCOUNTER — Encounter: Payer: Self-pay | Admitting: Physician Assistant

## 2022-05-25 ENCOUNTER — Ambulatory Visit: Payer: 59 | Attending: Nurse Practitioner | Admitting: Physician Assistant

## 2022-05-25 ENCOUNTER — Encounter: Payer: Self-pay | Admitting: *Deleted

## 2022-05-25 VITALS — BP 122/60 | HR 93 | Ht 65.0 in | Wt 193.2 lb

## 2022-05-25 DIAGNOSIS — E785 Hyperlipidemia, unspecified: Secondary | ICD-10-CM | POA: Diagnosis not present

## 2022-05-25 DIAGNOSIS — Z79899 Other long term (current) drug therapy: Secondary | ICD-10-CM

## 2022-05-25 DIAGNOSIS — D3500 Benign neoplasm of unspecified adrenal gland: Secondary | ICD-10-CM | POA: Diagnosis not present

## 2022-05-25 DIAGNOSIS — I252 Old myocardial infarction: Secondary | ICD-10-CM | POA: Diagnosis not present

## 2022-05-25 DIAGNOSIS — I10 Essential (primary) hypertension: Secondary | ICD-10-CM

## 2022-05-25 DIAGNOSIS — I4510 Unspecified right bundle-branch block: Secondary | ICD-10-CM | POA: Diagnosis not present

## 2022-05-25 MED ORDER — CARVEDILOL 3.125 MG PO TABS: 3.1250 mg | ORAL_TABLET | Freq: Two times a day (BID) | ORAL | 3 refills | Status: DC

## 2022-05-25 NOTE — Patient Instructions (Signed)
Medication Instructions:  1.Restart carvedilol 3.125 mg twice a day *If you need a refill on your cardiac medications before your next appointment, please call your pharmacy*   Lab Work: None ordered If you have labs (blood work) drawn today and your tests are completely normal, you will receive your results only by: [place] (if you have MyChart) OR A paper copy in the mail If you have any lab test that is abnormal or we need to change your treatment, we will call you to review the results.   Follow-Up: At [facility], you and your health needs are our priority.  As part of our continuing mission to provide you with exceptional heart care, we have created designated Provider Care Teams.  These Care Teams include your primary Cardiologist (physician) and Advanced Practice Providers (APPs -  Physician Assistants and Nurse Practitioners) who all work together to provide you with the care you need, when you need it.   Your next appointment:   3 month(s)  Provider:   Mertie Moores, MD  or Nicholes Rough, PA-C      Other Instructions Check your blood pressure daily, one hour after taking your morning medications for the next 2 weeks, keep a log and send [place] the readings at the end of the 2 weeks.

## 2022-05-31 DIAGNOSIS — Z4420 Encounter for fitting and adjustment of artificial eye, unspecified: Secondary | ICD-10-CM | POA: Diagnosis not present

## 2022-06-03 DIAGNOSIS — D4989 Neoplasm of unspecified behavior of other specified sites: Secondary | ICD-10-CM | POA: Diagnosis not present

## 2022-06-03 DIAGNOSIS — L989 Disorder of the skin and subcutaneous tissue, unspecified: Secondary | ICD-10-CM | POA: Diagnosis not present

## 2022-06-10 DIAGNOSIS — E7800 Pure hypercholesterolemia, unspecified: Secondary | ICD-10-CM | POA: Diagnosis not present

## 2022-06-10 DIAGNOSIS — R7303 Prediabetes: Secondary | ICD-10-CM | POA: Diagnosis not present

## 2022-06-10 DIAGNOSIS — D4989 Neoplasm of unspecified behavior of other specified sites: Secondary | ICD-10-CM | POA: Diagnosis not present

## 2022-06-10 DIAGNOSIS — H548 Legal blindness, as defined in USA: Secondary | ICD-10-CM | POA: Diagnosis not present

## 2022-06-10 DIAGNOSIS — M179 Osteoarthritis of knee, unspecified: Secondary | ICD-10-CM | POA: Diagnosis not present

## 2022-06-10 DIAGNOSIS — Z0000 Encounter for general adult medical examination without abnormal findings: Secondary | ICD-10-CM | POA: Diagnosis not present

## 2022-06-10 DIAGNOSIS — I10 Essential (primary) hypertension: Secondary | ICD-10-CM | POA: Diagnosis not present

## 2022-06-22 DIAGNOSIS — M170 Bilateral primary osteoarthritis of knee: Secondary | ICD-10-CM | POA: Diagnosis not present

## 2022-06-22 DIAGNOSIS — M1711 Unilateral primary osteoarthritis, right knee: Secondary | ICD-10-CM | POA: Diagnosis not present

## 2022-06-22 DIAGNOSIS — Z970 Presence of artificial eye: Secondary | ICD-10-CM | POA: Diagnosis not present

## 2022-06-22 DIAGNOSIS — M1712 Unilateral primary osteoarthritis, left knee: Secondary | ICD-10-CM | POA: Diagnosis not present

## 2022-07-11 DIAGNOSIS — H9203 Otalgia, bilateral: Secondary | ICD-10-CM | POA: Diagnosis not present

## 2022-08-05 ENCOUNTER — Telehealth: Payer: Self-pay | Admitting: *Deleted

## 2022-08-05 ENCOUNTER — Telehealth: Payer: Self-pay

## 2022-08-05 NOTE — Telephone Encounter (Signed)
Spoke with patients support person Bland Span) per dpr. Patient is scheduled for tele visit on 08/12/22. Med rec and consent done. Patient also needed to reschedule appointment with Dr. Elease Hashimoto due to support person not being able to bring him that day. Patient rescheduled for 11/01/22

## 2022-08-05 NOTE — Telephone Encounter (Signed)
  Patient Consent for Virtual Visit         Eric Day has provided verbal consent on 08/05/2022 for a virtual visit (video or telephone).   CONSENT FOR VIRTUAL VISIT FOR:  Eric Day  By participating in this virtual visit I agree to the following:  I hereby voluntarily request, consent and authorize [facility] and its employed or contracted physicians, physician assistants, nurse practitioners or other licensed health care professionals (the Practitioner), to provide me with telemedicine health care services (the "Services") as deemed necessary by the treating Practitioner. I acknowledge and consent to receive the Services by the Practitioner via telemedicine. I understand that the telemedicine visit will involve communicating with the Practitioner through live audiovisual communication technology and the disclosure of certain medical information by electronic transmission. I acknowledge that I have been given the opportunity to request an in-person assessment or other available alternative prior to the telemedicine visit and am voluntarily participating in the telemedicine visit.  I understand that I have the right to withhold or withdraw my consent to the use of telemedicine in the course of my care at any time, without affecting my right to future care or treatment, and that the Practitioner or I may terminate the telemedicine visit at any time. I understand that I have the right to inspect all information obtained and/or recorded in the course of the telemedicine visit and may receive copies of available information for a reasonable fee.  I understand that some of the potential risks of receiving the Services via telemedicine include:  Delay or interruption in medical evaluation due to technological equipment failure or disruption; Information transmitted may not be sufficient (e.g. poor resolution of images) to allow for appropriate medical decision making by the Practitioner;  and/or  In rare instances, security protocols could fail, causing a breach of personal health information.  Furthermore, I acknowledge that it is my responsibility to provide information about my medical history, conditions and care that is complete and accurate to the best of my ability. I acknowledge that Practitioner's advice, recommendations, and/or decision may be based on factors not within their control, such as incomplete or inaccurate data provided by me or distortions of diagnostic images or specimens that may result from electronic transmissions. I understand that the practice of medicine is not an exact science and that Practitioner makes no warranties or guarantees regarding treatment outcomes. I acknowledge that a copy of this consent can be made available to me via my patient portal [facility] MyChart), or I can request a printed copy by calling the office of [facility].    I understand that my insurance will be billed for this visit.   I have read or had this consent read to me. I understand the contents of this consent, which adequately explains the benefits and risks of the Services being provided via telemedicine.  I have been provided ample opportunity to ask questions regarding this consent and the Services and have had my questions answered to my satisfaction. I give my informed consent for the services to be provided through the use of telemedicine in my medical care

## 2022-08-05 NOTE — Telephone Encounter (Signed)
   Pre-operative Risk Assessment    Patient Name: Eric Day  DOB: [birth year] MRN: [id number]    PER CLEARANCE NOTES: PATIENT'S SURGERY AT "[facility]" WAS CANCELLED DUE TO "PATIENT WITH HX PHEOCHROMOCYTOMA STILL WITH PARTIAL PHEOCHROMOCYTOMA CHEST TIGHTNESS SYMPTOMS AND INTERMEDIATE RISK STRESS ECHO WITH LOW EF". PLEASE ADVISE CARDIOVASCULAR RISK STRATIFICATION. GIVEN THE ABOVE, OUR APP TEAM RECOMMENDS PATIENT'S SURGERY BE PERFORMED AT [facility]   Request for Surgical Clearance    Procedure:   RIGHT ANTERIOR ORBITOTOMY WITH Bx   Date of Surgery:  Clearance TBD                                 Surgeon:  DR. Dairl Ponder Surgeon's Group or Practice Name:  [facility]  Phone number:  [phone] Fax number:  [phone]   Type of Clearance Requested:   - Medical ; NO MEDICATIONS LISTED AS NEEDING TO BE HELD   Type of Anesthesia:  General    Additional requests/questions:    Elpidio Anis   08/05/2022, 12:50 PM

## 2022-08-05 NOTE — Telephone Encounter (Signed)
Primary Cardiologist:Philip Nahser, MD   Preoperative team, please contact this patient and set up a phone call appointment or he may await his appointment with Dr. Elease Hashimoto on 5/15 for further preoperative risk assessment. Please obtain consent and complete medication review. Thank you for your help.   I confirm that guidance regarding antiplatelet and oral anticoagulation therapy has been completed and, if necessary, noted below (none requested).   Levi Aland, NP-C  08/05/2022, 4:04 PM [address] Office [phone] Fax [phone]

## 2022-08-12 ENCOUNTER — Ambulatory Visit: Payer: 59 | Attending: Cardiovascular Disease | Admitting: Nurse Practitioner

## 2022-08-12 DIAGNOSIS — Z01810 Encounter for preprocedural cardiovascular examination: Secondary | ICD-10-CM | POA: Diagnosis not present

## 2022-08-12 NOTE — Progress Notes (Signed)
Virtual Visit via Telephone Note   Because of Eric Day's co-morbid illnesses, he is at least at moderate risk for complications without adequate follow up.  This format is felt to be most appropriate for this patient at this time.  The patient did not have access to video technology/had technical difficulties with video requiring transitioning to audio format only (telephone).  All issues noted in this document were discussed and addressed.  No physical exam could be performed with this format.  Please refer to the patient's chart for his consent to telehealth for [facility].  Evaluation Performed:  Preoperative cardiovascular risk assessment _____________   Date:  08/12/2022   Patient ID:  Eric Day, DOB 1960-05-22, MRN [id number] Patient Location:  Home Provider location:   Office  Primary Care Provider:  Tally Joe, MD Primary Cardiologist:  Kristeen Miss, MD  Chief Complaint / Patient Profile   62 y.o. 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 Dairl Ponder of [facility] and presents today for telephonic preoperative cardiovascular risk assessment.  History of Present Illness    Eric Day is a 62 y.o. male who presents via audio/video conferencing for a telehealth visit today.  Pt was last seen in cardiology clinic on 05/25/2022 by Jari Favre, PA.  At that time Eric Day was doing well.  The patient is now pending procedure as outlined above. Since his last visit, he has done well from a cardiac standpoint.   He denies chest pain, palpitations, dyspnea, pnd, orthopnea, n, v, dizziness, syncope, edema, weight gain, or early satiety. All other systems reviewed and are otherwise negative except as noted above.   Past Medical History    Past Medical History:  Diagnosis  Date   Arthritis    Colon cancer (HCC)    prostate; history of stomach cancer    Colon cancer (HCC)    Depression    ED (erectile dysfunction)    Eye disorder    rt artificial   Hard of hearing    bilat    Hypertension    Legally blind    Multiple falls    Myocardial infarction [facility])    2008   Neurofibromatosis (HCC)    Nocturia    Prosthetic eye globe    right eye    Shortness of breath dyspnea    walking distances or running    Tuberculosis    70's   Wears glasses    Past Surgical History:  Procedure Laterality Date   ABDOMINAL SURGERY  90's   stomach?   ANTERIOR CERVICAL DECOMP/DISCECTOMY FUSION N/A 07/07/2014   Procedure: ANTERIOR CERVICAL DECOMPRESSION/DISCECTOMY FUSION CERVICAL THREE-FOUR;  Surgeon: Donalee Citrin, MD;  Location: [facility] NEURO ORS;  Service: Neurosurgery;  Laterality: N/A;   CARDIAC CATHETERIZATION     EYE SURGERY  90's   HERNIA REPAIR     hernia as child   ROBOT ASSISTED LAPAROSCOPIC RADICAL PROSTATECTOMY N/A 05/07/2015   Procedure: ROBOTIC ASSISTED LAPAROSCOPIC RADICAL PROSTATECTOMY  LEVEL 3 LYSIS OF ADHESIONS/SMALL BOWEL RESECTION AND ANASTAMOSIS;  Surgeon: Heloise Purpura, MD;  Location: [facility] ORS;  Service: Urology;  Laterality: N/A;    Allergies  No Known Allergies  Home Medications    Prior to Admission medications   Medication Sig Start Date End Date Taking? Authorizing Provider  carvedilol (COREG) 3.125 MG tablet Take  1 tablet (3.125 mg total) by mouth 2 (two) times daily with a meal. 05/25/22   Sharlene Dory, PA-C  hydrocortisone (CORTEF) 5 MG tablet Take 5 mg by mouth 2 (two) times daily. 05/23/19   [provider]  omega-3 fish oil (MAXEPA) 1000 MG CAPS capsule Take 1 capsule by mouth every other day.    [provider]  valsartan (DIOVAN) 80 MG tablet Take 1 tablet (80 mg total) by mouth daily. 01/07/22   Nahser, Deloris Ping, MD    Physical Exam    Vital Signs:  Eric Day does not have vital signs available for review  today.  Given telephonic nature of communication, physical exam is limited. AAOx3. NAD. Normal affect.  Speech and respirations are unlabored.  Accessory Clinical Findings    None  Assessment & Plan    1.  Preoperative Cardiovascular Risk Assessment:  According to the Revised Cardiac Risk Index (RCRI), his Perioperative Risk of Major Cardiac Event is (%): 0.9. His Functional Capacity in METs is: 7.99 according to the Duke Activity Status Index (DASI). Therefore, based on ACC/AHA guidelines, patient would be at acceptable risk for the planned procedure without further cardiovascular testing.  The patient was advised that if he develops new symptoms prior to surgery to contact our office to arrange for a follow-up visit, and he verbalized understanding.   A copy of this note will be routed to requesting surgeon.  Time:   Today, I have spent 5 minutes with the patient with telehealth technology discussing medical history, symptoms, and management plan.     Joylene Grapes, NP  08/12/2022, 2:52 PM

## 2022-08-17 ENCOUNTER — Other Ambulatory Visit: Payer: Self-pay

## 2022-08-17 ENCOUNTER — Encounter (HOSPITAL_COMMUNITY): Payer: Self-pay | Admitting: Optometry

## 2022-08-17 NOTE — Progress Notes (Addendum)
Mrs. Eric Day, the friend of Mr. Eric Day was called at [phone] with questions and instructions for the surgery day. Mrs. Eric Day states that the patient is not complaining of any acute distress, at this time, no signs/symptoms of COVID. Mrs. Eric Day verbalized understanding of all instructions.   PCP - Tally Joe, MD Cardiologist - Kristeen Miss, MD  PPM/ICD - denies  EKG - 11/09/21 Stress Test - 01/05/22 ECHO - 01/05/22 Cardiac Cath - 2006  CPAP - n/a  Fasting Blood Sugar - n/a  Blood Thinner Instructions: n/a Patient was instructed: As of today, STOP taking any Aspirin (unless otherwise instructed by your surgeon) Aleve, Naproxen, Ibuprofen, Motrin, Advil, Goody's, BC's, all herbal medications, fish oil, and all vitamins.   ERAS Protcol - yes, until 06:00 o'clock  COVID TEST- n/a  Anesthesia review: yes  Patient verbally denies any shortness of breath, fever, cough and chest pain during phone call   -------------  SDW INSTRUCTIONS given:  Your procedure is scheduled on Friday, May 3rd, 2024.  Report to [facility] Main Entrance "A" at 06:30 A.M., and check in at the Admitting office.  Call this number if you have problems the morning of surgery:  [phone]   Remember:  Do not eat after midnight the night before your surgery  You may drink clear liquids until 06:00 the morning of your surgery.   Clear liquids allowed are: Water, Non-Citrus Juices (without pulp), Carbonated Beverages, Clear Tea, Black Coffee Only, and Gatorade    Take these medicines the morning of surgery with A SIP OF WATER:  Coreg, Cortef    The day of surgery:                    Do not wear jewelry,             Do not wear lotions, powders, perfumes, or deodorant.            Men may shave face and neck.            Do not bring valuables to the hospital.            [facility] is not responsible for any belongings or valuables.  Do NOT Smoke (Tobacco/Vaping) 24 hours prior to  your procedure If you use a CPAP at night, you may bring all equipment for your overnight stay.   Contacts, glasses, dentures or bridgework may not be worn into surgery.      For patients admitted to the hospital, discharge time will be determined by your treatment team.   Patients discharged the day of surgery will not be allowed to drive home, and someone needs to stay with them for 24 hours.    Special instructions:   - Preparing For Surgery  Before surgery, you can play an important role. Because skin is not sterile, your skin needs to be as free of germs as possible. You can reduce the number of germs on your skin by washing with CHG (chlorahexidine gluconate) Soap before surgery.  CHG is an antiseptic cleaner which kills germs and bonds with the skin to continue killing germs even after washing.    Oral Hygiene is also important to reduce your risk of infection.  Remember - BRUSH YOUR TEETH THE MORNING OF SURGERY WITH YOUR REGULAR TOOTHPASTE  Please do not use if you have an allergy to CHG or antibacterial soaps. If your skin becomes reddened/irritated stop using the CHG.  Do not shave (including legs and  underarms) for at least 48 hours prior to first CHG shower. It is OK to shave your face.  Please follow these instructions carefully.   Shower the NIGHT BEFORE SURGERY and the MORNING OF SURGERY with DIAL Soap.   Pat yourself dry with a CLEAN TOWEL.  Wear CLEAN PAJAMAS to bed the night before surgery  Place CLEAN SHEETS on your bed the night of your first shower and DO NOT SLEEP WITH PETS.   Day of Surgery: Please shower morning of surgery  Wear Clean/Comfortable clothing the morning of surgery Do not apply any deodorants/lotions.   Remember to brush your teeth WITH YOUR REGULAR TOOTHPASTE.   Questions were answered. Patient verbalized understanding of instructions.

## 2022-08-18 NOTE — Progress Notes (Signed)
Anesthesia Chart Review: Same day workup  Follows with cardiology for history of prior type II MI with normal coronaries by catheterization 2006, right bundle branch block, hypertension, hyperlipidemia, pheochromocytoma (primarily followed by endocrinology at [facility]).  Seen by Bernadene Person, NP 08/12/2022 for preop evaluation.  Per note, "According to the Revised Cardiac Risk Index (RCRI), his Perioperative Risk of Major Cardiac Event is (%): 0.9. His Functional Capacity in METs is: 7.99 according to the Duke Activity Status Index (DASI). Therefore, based on ACC/AHA guidelines, patient would be at acceptable risk for the planned procedure without further cardiovascular testing. The patient was advised that if he develops new symptoms prior to surgery to contact our office to arrange for a follow-up visit, and he verbalized understanding."  Follows with endocrinology at [facility] for history of neurofibromatosis with recurrent pheochromocytoma.  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  Most recently underwent right adrenalectomy and resection of small bowel/mesenteric mass 04/2019.  Plasma free metanephrines continue to be high after surgery but better than presurgery.  He was not recommended to undergo bilateral complete adrenalectomy as it would render him lifelong adrenal insufficiency which was felt to be more challenging riskier in his case given multiple disabilities and limited support system.  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  He is maintained on amlodipine 5 mg daily and carvedilol 3.125 mg twice daily to cover his residual pheochromocytoma.  Patient will need day of surgery labs and evaluation.  EKG 05/25/2022: Sinus rhythm with sinus arrhythmia with frequent PVCs.  Rate 93.  Biatrial enlargement.  Right bundle branch block.  Inferior  infarct, age undetermined.  Nuclear stress 01/05/2022:   Findings are consistent with prior myocardial infarction with peri-infarct ischemia. The study is intermediate risk.   No ST deviation was noted.   LV perfusion is abnormal. There is evidence of ischemia. There is evidence of infarction. Defect 1: There is a medium defect with mild reduction in uptake present in the apical to basal inferior and inferolateral location(s) that is partially reversible. There is abnormal wall motion in the defect area. Consistent with infarction and peri-infarct ischemia.   Left ventricular function is abnormal. Nuclear stress EF: 45 %. The left ventricular ejection fraction is mildly decreased (45-54%). End diastolic cavity size is normal. End systolic cavity size is normal.   Prior study not available for comparison.   Partially reversible basal to apical inferior/inferolateral perfusion defect with abnormal wall motion, consistent with infarct with peri-infarct ischemia Mild systolic dysfunction, EF 45% Intermediate risk study  TTE 01/05/2022:  1. Left ventricular ejection fraction, by estimation, is 50 to 55%. The  left ventricle has low normal function. The left ventricle has no regional  wall motion abnormalities. Left ventricular diastolic parameters are  consistent with Grade I diastolic  dysfunction (impaired relaxation).   2. Right ventricular systolic function is normal. The right ventricular  size is normal. There is normal pulmonary artery systolic pressure. The  estimated right ventricular systolic pressure is 26.8 mmHg.   3. The mitral valve is normal in structure. Trivial mitral valve  regurgitation. No evidence of mitral stenosis.   4. The aortic valve is tricuspid. Aortic valve regurgitation is not  visualized. No aortic stenosis is present.   5. The inferior vena cava is normal in size with greater than 50%  respiratory variability, suggesting right atrial pressure of 3 mmHg.  [place], [facility] Short Stay Center/Anesthesiology Phone [phone] 08/18/2022 11:47 AM

## 2022-08-18 NOTE — Anesthesia Preprocedure Evaluation (Addendum)
Anesthesia Evaluation  Patient identified by MRN, date of birth, ID band Patient awake    Reviewed: Allergy & Precautions, NPO status , Patient's Chart, lab work & pertinent test results  History of Anesthesia Complications Negative for: history of anesthetic complications  Airway Mallampati: II  TM Distance: >3 FB Neck ROM: Full    Dental  (+) Edentulous Upper, Missing,    Pulmonary Current Smoker   Pulmonary exam normal        Cardiovascular hypertension, Pt. on medications + Past MI (2008)  Normal cardiovascular exam(-) dysrhythmias      Neuro/Psych    Depression    Neurofibromatosis    GI/Hepatic   Endo/Other  Recurrent pheochromocytoma, s/p unilateral adrenalectomy, currently asymptomatic  Renal/GU   negative genitourinary   Musculoskeletal  (+) Arthritis ,    Abdominal   Peds  Hematology negative hematology ROS (+)   Anesthesia Other Findings EXPOSURE ORBITAL IMPLANT RIGHT EYE  Reproductive/Obstetrics                             Anesthesia Physical Anesthesia Plan  ASA: 3  Anesthesia Plan: General   Post-op Pain Management: Tylenol PO (pre-op)*   Induction: Intravenous  PONV Risk Score and Plan: 1 and Treatment may vary due to age or medical condition, Midazolam, Dexamethasone and Ondansetron  Airway Management Planned: Oral ETT  Additional Equipment: ClearSight  Intra-op Plan:   Post-operative Plan: Extubation in OR  Informed Consent: I have reviewed the patients History and Physical, chart, labs and discussed the procedure including the risks, benefits and alternatives for the proposed anesthesia with the patient or authorized representative who has indicated his/her understanding and acceptance.     Dental advisory given  Plan Discussed with: CRNA  Anesthesia Plan Comments: (PAT note by Antionette Poles, PA-C:  Follows with cardiology for history of prior type  II MI with normal coronaries by catheterization 2006, right bundle branch block, hypertension, hyperlipidemia, pheochromocytoma (primarily followed by endocrinology at [facility]).  Seen by Bernadene Person, NP 08/12/2022 for preop evaluation.  Per note, "According to the Revised Cardiac Risk Index (RCRI), his Perioperative Risk of Major Cardiac Event is (%): 0.9. His Functional Capacity in METs is: 7.99 according to the Duke Activity Status Index (DASI). Therefore, based on ACC/AHA guidelines, patient would be at acceptable risk for the planned procedure without further cardiovascular testing. The patient was advised that if he develops new symptoms prior to surgery to contact our office to arrange for a follow-up visit, and he verbalized understanding."  Follows with endocrinology at [facility] for history of neurofibromatosis with recurrent pheochromocytoma.  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  Most recently underwent right adrenalectomy and resection of small bowel/mesenteric mass 04/2019.  Plasma free metanephrines continue to be high after surgery but better than presurgery.  He was not recommended to undergo bilateral complete adrenalectomy as it would render him lifelong adrenal insufficiency which was felt to be more challenging riskier in his case given multiple disabilities and limited support system.  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  He is maintained on amlodipine 5 mg daily and carvedilol 3.125 mg twice daily to cover his residual pheochromocytoma.  Patient will need day of surgery labs and evaluation.  EKG 05/25/2022: Sinus rhythm with sinus arrhythmia with frequent PVCs.  Rate 93.  Biatrial  enlargement.  Right bundle branch block.  Inferior infarct, age undetermined.  Nuclear stress 01/05/2022:   Findings are consistent with prior  myocardial infarction with peri-infarct ischemia. The study is intermediate risk.   No ST deviation was noted.   LV perfusion is abnormal. There is evidence of ischemia. There is evidence of infarction. Defect 1: There is a medium defect with mild reduction in uptake present in the apical to basal inferior and inferolateral location(s) that is partially reversible. There is abnormal wall motion in the defect area. Consistent with infarction and peri-infarct ischemia.   Left ventricular function is abnormal. Nuclear stress EF: 45 %. The left ventricular ejection fraction is mildly decreased (45-54%). End diastolic cavity size is normal. End systolic cavity size is normal.   Prior study not available for comparison.  1. Partially reversible basal to apical inferior/inferolateral perfusion defect with abnormal wall motion, consistent with infarct with peri-infarct ischemia 2. Mild systolic dysfunction, EF 45% 3. Intermediate risk study  TTE 01/05/2022: 1. Left ventricular ejection fraction, by estimation, is 50 to 55%. The  left ventricle has low normal function. The left ventricle has no regional  wall motion abnormalities. Left ventricular diastolic parameters are  consistent with Grade I diastolic  dysfunction (impaired relaxation).  2. Right ventricular systolic function is normal. The right ventricular  size is normal. There is normal pulmonary artery systolic pressure. The  estimated right ventricular systolic pressure is 26.8 mmHg.  3. The mitral valve is normal in structure. Trivial mitral valve  regurgitation. No evidence of mitral stenosis.  4. The aortic valve is tricuspid. Aortic valve regurgitation is not  visualized. No aortic stenosis is present.  5. The inferior vena cava is normal in size with greater than 50%  respiratory variability, suggesting right atrial pressure of 3 mmHg.   )        Anesthesia Quick Evaluation

## 2022-08-19 ENCOUNTER — Ambulatory Visit (HOSPITAL_COMMUNITY): Payer: 59 | Admitting: Physician Assistant

## 2022-08-19 ENCOUNTER — Encounter (HOSPITAL_COMMUNITY)
Admission: RE | Disposition: A | Discharge status: Home / Self Care | Payer: Self-pay | Source: Home / Self Care | Attending: Optometry

## 2022-08-19 ENCOUNTER — Encounter (HOSPITAL_COMMUNITY): Payer: Self-pay | Admitting: Optometry

## 2022-08-19 ENCOUNTER — Other Ambulatory Visit: Payer: Self-pay

## 2022-08-19 ENCOUNTER — Ambulatory Visit (HOSPITAL_COMMUNITY)
Admission: RE | Admit: 2022-08-19 | Discharge: 2022-08-19 | Disposition: A | Discharge status: Home / Self Care | Payer: 59 | Attending: Optometry | Admitting: Optometry

## 2022-08-19 ENCOUNTER — Ambulatory Visit (HOSPITAL_BASED_OUTPATIENT_CLINIC_OR_DEPARTMENT_OTHER): Payer: 59 | Admitting: Physician Assistant

## 2022-08-19 DIAGNOSIS — I10 Essential (primary) hypertension: Secondary | ICD-10-CM

## 2022-08-19 DIAGNOSIS — T85898A Other specified complication of other internal prosthetic devices, implants and grafts, initial encounter: Secondary | ICD-10-CM | POA: Diagnosis not present

## 2022-08-19 DIAGNOSIS — Q8500 Neurofibromatosis, unspecified: Secondary | ICD-10-CM | POA: Diagnosis not present

## 2022-08-19 DIAGNOSIS — M1990 Unspecified osteoarthritis, unspecified site: Secondary | ICD-10-CM | POA: Diagnosis not present

## 2022-08-19 DIAGNOSIS — I252 Old myocardial infarction: Secondary | ICD-10-CM | POA: Insufficient documentation

## 2022-08-19 DIAGNOSIS — F17200 Nicotine dependence, unspecified, uncomplicated: Secondary | ICD-10-CM | POA: Diagnosis not present

## 2022-08-19 DIAGNOSIS — Y838 Other surgical procedures as the cause of abnormal reaction of the patient, or of later complication, without mention of misadventure at the time of the procedure: Secondary | ICD-10-CM | POA: Insufficient documentation

## 2022-08-19 DIAGNOSIS — H119 Unspecified disorder of conjunctiva: Secondary | ICD-10-CM | POA: Diagnosis not present

## 2022-08-19 DIAGNOSIS — F32A Depression, unspecified: Secondary | ICD-10-CM | POA: Diagnosis not present

## 2022-08-19 DIAGNOSIS — I4510 Unspecified right bundle-branch block: Secondary | ICD-10-CM | POA: Insufficient documentation

## 2022-08-19 DIAGNOSIS — H3091 Unspecified chorioretinal inflammation, right eye: Secondary | ICD-10-CM | POA: Diagnosis not present

## 2022-08-19 DIAGNOSIS — T85320A Displacement of prosthetic orbit of right eye, initial encounter: Secondary | ICD-10-CM | POA: Diagnosis not present

## 2022-08-19 DIAGNOSIS — D4989 Neoplasm of unspecified behavior of other specified sites: Secondary | ICD-10-CM | POA: Diagnosis not present

## 2022-08-19 DIAGNOSIS — Z79899 Other long term (current) drug therapy: Secondary | ICD-10-CM | POA: Diagnosis not present

## 2022-08-19 DIAGNOSIS — F17210 Nicotine dependence, cigarettes, uncomplicated: Secondary | ICD-10-CM

## 2022-08-19 HISTORY — PX: SKIN FULL THICKNESS GRAFT: SHX442

## 2022-08-19 HISTORY — PX: SKIN BIOPSY: SHX1

## 2022-08-19 HISTORY — PX: ORIF ORBITAL FRACTURE: SHX5312

## 2022-08-19 LAB — BASIC METABOLIC PANEL
Anion gap: 8 (ref 5–15)
BUN: 9 mg/dL (ref 8–23)
CO2: 22 mmol/L (ref 22–32)
Calcium: 9.8 mg/dL (ref 8.9–10.3)
Chloride: 106 mmol/L (ref 98–111)
Creatinine, Ser: 0.99 mg/dL (ref 0.61–1.24)
GFR, Estimated: >60 mL/min (ref >60)
Glucose, Bld: 100 mg/dL — ABNORMAL HIGH (ref 70–99)
Potassium: 4.6 mmol/L (ref 3.5–5.1)
Sodium: 136 mmol/L (ref 135–145)

## 2022-08-19 LAB — CBC
HCT: 48.1 % (ref 39.0–52.0)
Hemoglobin: 15.7 g/dL (ref 13.0–17.0)
MCH: 26.5 pg (ref 26.0–34.0)
MCHC: 32.6 g/dL (ref 30.0–36.0)
MCV: 81.1 fL (ref 80.0–100.0)
Platelets: 229 10*3/uL (ref 150–400)
RBC: 5.93 MIL/uL — ABNORMAL HIGH (ref 4.22–5.81)
RDW: 14.3 % (ref 11.5–15.5)
WBC: 7.4 10*3/uL (ref 4.0–10.5)
nRBC: 0 % (ref 0.0–0.2)

## 2022-08-19 SURGERY — OPEN REDUCTION INTERNAL FIXATION (ORIF) ORBITAL FRACTURE
Anesthesia: General | Site: Leg Upper | Laterality: Right

## 2022-08-19 MED ORDER — ERYTHROMYCIN 5 MG/GM OP OINT
TOPICAL_OINTMENT | Freq: Once | OPHTHALMIC | Status: DC
  Filled 2022-08-19: qty 3.5

## 2022-08-19 MED ORDER — OXYCODONE HCL 5 MG/5ML PO SOLN: 5.0000 mg | Freq: Once | ORAL | Status: DC | PRN

## 2022-08-19 MED ORDER — LACTATED RINGERS IV SOLN: INTRAVENOUS | Status: DC

## 2022-08-19 MED ORDER — ACETAMINOPHEN 650 MG RE SUPP: 650.0000 mg | RECTAL | Status: DC | PRN

## 2022-08-19 MED ORDER — SUGAMMADEX SODIUM 200 MG/2ML IV SOLN
INTRAVENOUS | Status: DC | PRN
  Administered 2022-08-19: 200 mg via INTRAVENOUS

## 2022-08-19 MED ORDER — ERYTHROMYCIN 5 MG/GM OP OINT
TOPICAL_OINTMENT | OPHTHALMIC | Status: DC | PRN
  Administered 2022-08-19: 1 via OPHTHALMIC

## 2022-08-19 MED ORDER — CEFAZOLIN SODIUM-DEXTROSE 2-3 GM-%(50ML) IV SOLR
INTRAVENOUS | Status: DC | PRN
  Administered 2022-08-19: 2 g via INTRAVENOUS

## 2022-08-19 MED ORDER — BUPIVACAINE HCL (PF) 0.75 % IJ SOLN
INTRAMUSCULAR | Status: AC
  Filled 2022-08-19: qty 10

## 2022-08-19 MED ORDER — CHLORHEXIDINE GLUCONATE 0.12 % MT SOLN
15.0000 mL | Freq: Once | OROMUCOSAL | Status: AC
  Administered 2022-08-19: 15 mL via OROMUCOSAL
  Filled 2022-08-19: qty 15

## 2022-08-19 MED ORDER — PHENYLEPHRINE 80 MCG/ML (10ML) SYRINGE FOR IV PUSH (FOR BLOOD PRESSURE SUPPORT)
PREFILLED_SYRINGE | INTRAVENOUS | Status: AC
  Filled 2022-08-19: qty 10

## 2022-08-19 MED ORDER — ROCURONIUM BROMIDE 10 MG/ML (PF) SYRINGE
PREFILLED_SYRINGE | INTRAVENOUS | Status: AC
  Filled 2022-08-19: qty 10

## 2022-08-19 MED ORDER — LIDOCAINE 2% (20 MG/ML) 5 ML SYRINGE
INTRAMUSCULAR | Status: AC
  Filled 2022-08-19: qty 5

## 2022-08-19 MED ORDER — CARVEDILOL 3.125 MG PO TABS: 3.1250 mg | ORAL_TABLET | Freq: Once | ORAL | Status: AC

## 2022-08-19 MED ORDER — PHENYLEPHRINE HCL (PRESSORS) 10 MG/ML IV SOLN
INTRAVENOUS | Status: DC | PRN
  Administered 2022-08-19: 160 ug via INTRAVENOUS

## 2022-08-19 MED ORDER — PROPOFOL 10 MG/ML IV BOLUS
INTRAVENOUS | Status: DC | PRN
  Administered 2022-08-19: 160 mg via INTRAVENOUS
  Administered 2022-08-19: 40 mg via INTRAVENOUS

## 2022-08-19 MED ORDER — 0.9 % SODIUM CHLORIDE (POUR BTL) OPTIME
TOPICAL | Status: DC | PRN
  Administered 2022-08-19: 1000 mL

## 2022-08-19 MED ORDER — KETOROLAC TROMETHAMINE 15 MG/ML IJ SOLN: 15.0000 mg | Freq: Four times a day (QID) | INTRAMUSCULAR | Status: DC

## 2022-08-19 MED ORDER — LIDOCAINE-EPINEPHRINE 1 %-1:100000 IJ SOLN
INTRAMUSCULAR | Status: AC
  Filled 2022-08-19: qty 1

## 2022-08-19 MED ORDER — ACETAMINOPHEN 325 MG PO TABS: 650.0000 mg | ORAL_TABLET | ORAL | Status: DC | PRN

## 2022-08-19 MED ORDER — LIDOCAINE-EPINEPHRINE 1 %-1:100000 IJ SOLN
INTRAMUSCULAR | Status: DC | PRN
  Administered 2022-08-19: 7 mL
  Administered 2022-08-19: 3 mL

## 2022-08-19 MED ORDER — FENTANYL CITRATE (PF) 100 MCG/2ML IJ SOLN: 25.0000 ug | INTRAMUSCULAR | Status: DC | PRN

## 2022-08-19 MED ORDER — BSS IO SOLN
INTRAOCULAR | Status: AC
  Filled 2022-08-19: qty 15

## 2022-08-19 MED ORDER — ACETAMINOPHEN 500 MG PO TABS: 1000.0000 mg | ORAL_TABLET | Freq: Four times a day (QID) | ORAL | Status: DC

## 2022-08-19 MED ORDER — FENTANYL CITRATE (PF) 100 MCG/2ML IJ SOLN
INTRAMUSCULAR | Status: DC | PRN
  Administered 2022-08-19 (×2): 50 ug via INTRAVENOUS

## 2022-08-19 MED ORDER — AMISULPRIDE (ANTIEMETIC) 5 MG/2ML IV SOLN: 10.0000 mg | Freq: Once | INTRAVENOUS | Status: DC | PRN

## 2022-08-19 MED ORDER — ACETAMINOPHEN 500 MG PO TABS
1000.0000 mg | ORAL_TABLET | Freq: Once | ORAL | Status: AC
  Administered 2022-08-19: 1000 mg via ORAL
  Filled 2022-08-19: qty 2

## 2022-08-19 MED ORDER — OXYCODONE HCL 5 MG PO TABS: 5.0000 mg | ORAL_TABLET | Freq: Once | ORAL | Status: DC | PRN

## 2022-08-19 MED ORDER — SODIUM CHLORIDE 0.9 % IV SOLN: INTRAVENOUS | Status: DC

## 2022-08-19 MED ORDER — LIDOCAINE 2% (20 MG/ML) 5 ML SYRINGE
INTRAMUSCULAR | Status: DC | PRN
  Administered 2022-08-19: 100 mg via INTRAVENOUS

## 2022-08-19 MED ORDER — CARVEDILOL 3.125 MG PO TABS
ORAL_TABLET | ORAL | Status: AC
  Administered 2022-08-19: 3.125 mg via ORAL
  Filled 2022-08-19: qty 1

## 2022-08-19 MED ORDER — ORAL CARE MOUTH RINSE: 15.0000 mL | Freq: Once | OROMUCOSAL | Status: AC

## 2022-08-19 MED ORDER — ONDANSETRON HCL 4 MG/2ML IJ SOLN
INTRAMUSCULAR | Status: AC
  Filled 2022-08-19: qty 2

## 2022-08-19 MED ORDER — EPHEDRINE SULFATE-NACL 50-0.9 MG/10ML-% IV SOSY
PREFILLED_SYRINGE | INTRAVENOUS | Status: DC | PRN
  Administered 2022-08-19: 5 mg via INTRAVENOUS

## 2022-08-19 MED ORDER — ERYTHROMYCIN 5 MG/GM OP OINT
TOPICAL_OINTMENT | OPHTHALMIC | Status: AC
  Filled 2022-08-19: qty 3.5

## 2022-08-19 MED ORDER — PROPOFOL 10 MG/ML IV BOLUS
INTRAVENOUS | Status: AC
  Filled 2022-08-19: qty 20

## 2022-08-19 MED ORDER — PHENYLEPHRINE HCL-NACL 20-0.9 MG/250ML-% IV SOLN
INTRAVENOUS | Status: DC | PRN
  Administered 2022-08-19: 40 ug/min via INTRAVENOUS

## 2022-08-19 MED ORDER — DEXAMETHASONE SODIUM PHOSPHATE 4 MG/ML IJ SOLN
INTRAMUSCULAR | Status: DC | PRN
  Administered 2022-08-19: 10 mg via INTRAVENOUS

## 2022-08-19 MED ORDER — ROCURONIUM BROMIDE 100 MG/10ML IV SOLN
INTRAVENOUS | Status: DC | PRN
  Administered 2022-08-19: 60 mg via INTRAVENOUS

## 2022-08-19 MED ORDER — TETRACAINE HCL 0.5 % OP SOLN
OPHTHALMIC | Status: AC
  Filled 2022-08-19: qty 4

## 2022-08-19 MED ORDER — SODIUM CHLORIDE 0.9% FLUSH: 3.0000 mL | INTRAVENOUS | Status: DC | PRN

## 2022-08-19 MED ORDER — BACITRACIN ZINC 500 UNIT/GM EX OINT
TOPICAL_OINTMENT | CUTANEOUS | Status: DC | PRN
  Administered 2022-08-19: 1 via TOPICAL

## 2022-08-19 MED ORDER — FENTANYL CITRATE (PF) 250 MCG/5ML IJ SOLN
INTRAMUSCULAR | Status: AC
  Filled 2022-08-19: qty 5

## 2022-08-19 MED ORDER — DEXAMETHASONE SODIUM PHOSPHATE 10 MG/ML IJ SOLN
INTRAMUSCULAR | Status: AC
  Filled 2022-08-19: qty 1

## 2022-08-19 MED ORDER — SODIUM CHLORIDE 0.9 % IV SOLN: 250.0000 mL | INTRAVENOUS | Status: DC | PRN

## 2022-08-19 MED ORDER — ONDANSETRON HCL 4 MG/2ML IJ SOLN
INTRAMUSCULAR | Status: DC | PRN
  Administered 2022-08-19: 4 mg via INTRAVENOUS

## 2022-08-19 MED ORDER — BACITRACIN ZINC 500 UNIT/GM EX OINT
TOPICAL_OINTMENT | CUTANEOUS | Status: AC
  Filled 2022-08-19: qty 28.35

## 2022-08-19 MED ORDER — BUPIVACAINE HCL (PF) 0.75 % IJ SOLN
INTRAMUSCULAR | Status: DC | PRN
  Administered 2022-08-19: 5 mL
  Administered 2022-08-19: 3 mL
  Administered 2022-08-19: 7 mL

## 2022-08-19 SURGICAL SUPPLY — 87 items
APL SRG 3 HI ABS STRL LF PLS (MISCELLANEOUS) ×6
APL SWBSTK 6 STRL LF DISP (MISCELLANEOUS) ×2
APPLICATOR COTTON TIP 6 STRL (MISCELLANEOUS) ×2 IMPLANT
APPLICATOR COTTON TIP 6IN STRL (MISCELLANEOUS) ×2
APPLICATOR DR MATTHEWS STRL (MISCELLANEOUS) ×4 IMPLANT
BAG COUNTER SPONGE SURGICOUNT (BAG) ×2 IMPLANT
BAG SPNG CNTER NS LX DISP (BAG) ×2
BLADE SURG 15 STRL LF DISP TIS (BLADE) ×2 IMPLANT
BLADE SURG 15 STRL SS (BLADE) ×2
BNDG ADH 5X2 AIR PERM ELC (GAUZE/BANDAGES/DRESSINGS)
BNDG COHESIVE 2X5 WHT NS (GAUZE/BANDAGES/DRESSINGS) ×2 IMPLANT
BNDG EYE OVAL 2 1/8 X 2 5/8 (GAUZE/BANDAGES/DRESSINGS) IMPLANT
CATH URET WHISTLE 8FR 331008 (CATHETERS) ×2 IMPLANT
CATH VISIONS PV 0.018 (CATHETERS) ×2 IMPLANT
CLSR STERI-STRIP ANTIMIC 1/2X4 (GAUZE/BANDAGES/DRESSINGS) ×2 IMPLANT
CNTNR URN SCR LID CUP LEK RST (MISCELLANEOUS) IMPLANT
CONFORMER OPHTHALMIC LG W/HOLE (MISCELLANEOUS) IMPLANT
CONT SPEC 4OZ STRL OR WHT (MISCELLANEOUS) ×2
CORD BIPOLAR FORCEPS 12FT (ELECTRODE) ×2 IMPLANT
COVER LIGHT HANDLE STERIS (MISCELLANEOUS) ×2 IMPLANT
COVER SURGICAL LIGHT HANDLE (MISCELLANEOUS) ×2 IMPLANT
DRAPE ORTHO SPLIT 77X108 STRL (DRAPES) ×2
DRAPE SURG 17X23 STRL (DRAPES) ×4 IMPLANT
DRAPE SURG ORHT 6 SPLT 77X108 (DRAPES) ×2 IMPLANT
DRAPE UTILITY XL STRL (DRAPES) ×2 IMPLANT
DRSG TEGADERM 4X4.75 (GAUZE/BANDAGES/DRESSINGS) IMPLANT
DRSG TELFA 3X8 NADH STRL (GAUZE/BANDAGES/DRESSINGS) IMPLANT
ELECT NDL BLADE 2-5/6 (NEEDLE) ×2 IMPLANT
ELECT NEEDLE BLADE 2-5/6 (NEEDLE) ×2 IMPLANT
ELECT REM PT RETURN 9FT ADLT (ELECTROSURGICAL) ×2
ELECTRODE REM PT RTRN 9FT ADLT (ELECTROSURGICAL) IMPLANT
FORCEPS BIPOLAR SPETZLER 8 1.0 (NEUROSURGERY SUPPLIES) ×2 IMPLANT
GAUZE 4X4 16PLY ~~LOC~~+RFID DBL (SPONGE) IMPLANT
GAUZE SPONGE 4X4 12PLY STRL (GAUZE/BANDAGES/DRESSINGS) IMPLANT
GLOVE BIO SURGEON STRL SZ7.5 (GLOVE) ×4 IMPLANT
GOWN STRL REUS W/ TWL LRG LVL3 (GOWN DISPOSABLE) ×4 IMPLANT
GOWN STRL REUS W/TWL LRG LVL3 (GOWN DISPOSABLE) ×4
HEMOSTAT SURGICEL 2X14 (HEMOSTASIS) ×2 IMPLANT
KIT BASIN OR (CUSTOM PROCEDURE TRAY) ×2 IMPLANT
KIT TURNOVER KIT B (KITS) ×2 IMPLANT
NDL HYPO 30X.5 LL (NEEDLE) ×2 IMPLANT
NDL PRECISIONGLIDE 27X1.5 (NEEDLE) IMPLANT
NDL RETROBULBAR 25GX1.5 (NEEDLE) IMPLANT
NEEDLE HYPO 30X.5 LL (NEEDLE) ×2 IMPLANT
NEEDLE PRECISIONGLIDE 27X1.5 (NEEDLE) ×4 IMPLANT
NEEDLE RETROBULBAR 25GX1.5 (NEEDLE) ×2 IMPLANT
NS IRRIG 1000ML POUR BTL (IV SOLUTION) ×2 IMPLANT
PACK BASIC III (CUSTOM PROCEDURE TRAY) ×2
PACK CATARACT CUSTOM (CUSTOM PROCEDURE TRAY) ×2 IMPLANT
PACK SRG BSC III STRL LF ECLPS (CUSTOM PROCEDURE TRAY) ×2 IMPLANT
PAD ARMBOARD 7.5X6 YLW CONV (MISCELLANEOUS) ×4 IMPLANT
PATTIES SURGICAL .5 X3 (DISPOSABLE) ×2 IMPLANT
PENCIL BUTTON HOLSTER BLD 10FT (ELECTRODE) IMPLANT
PROTECTOR CORNEAL (OPHTHALMIC RELATED) IMPLANT
SHIELD EYE LENSE ONLY DISP (GAUZE/BANDAGES/DRESSINGS) IMPLANT
SPECIMEN JAR SMALL (MISCELLANEOUS) ×2 IMPLANT
SPONGE T-LAP 18X18 ~~LOC~~+RFID (SPONGE) IMPLANT
STRIP CLOSURE SKIN 1/2X4 (GAUZE/BANDAGES/DRESSINGS) ×2 IMPLANT
SUCTION FRAZIER TIP 8 FR DISP (SUCTIONS) ×2
SUCTION TUBE FRAZIER 8FR DISP (SUCTIONS) IMPLANT
SUT CHROMIC 5 0 P 3 (SUTURE) ×2 IMPLANT
SUT CHROMIC 6 0 PS 4 (SUTURE) IMPLANT
SUT ETHILON 6 0 P 1 (SUTURE) IMPLANT
SUT ETHILON 7 0 P 6 18 (SUTURE) ×2 IMPLANT
SUT GUT PLAIN 6-0 1X18 ABS (SUTURE) IMPLANT
SUT MERSILENE 5 0 RD 1 DA (SUTURE) ×2 IMPLANT
SUT MON AB 5-0 P3 18 (SUTURE) IMPLANT
SUT PLAIN 5 0 P 3 18 (SUTURE) ×2 IMPLANT
SUT PLAIN 6 0 TG1408 (SUTURE) IMPLANT
SUT PROLENE 6 0 C 1 24 (SUTURE) ×2 IMPLANT
SUT SILK 2 0 (SUTURE) ×2
SUT SILK 2-0 18XBRD TIE 12 (SUTURE) ×2 IMPLANT
SUT SILK 4 0 P 3 (SUTURE) ×2 IMPLANT
SUT VIC AB 4-0 P-3 18X BRD (SUTURE) ×2 IMPLANT
SUT VIC AB 4-0 P-3 18XBRD (SUTURE) IMPLANT
SUT VIC AB 4-0 P3 18 (SUTURE) ×4
SUT VIC AB 5-0 DAS24 8 (SUTURE) ×2 IMPLANT
SUT VICRYL 6-0 S14 (SUTURE) IMPLANT
SUT VICRYL 7 0 TG140 8 (SUTURE) ×2 IMPLANT
SYR 50ML LL SCALE MARK (SYRINGE) ×2 IMPLANT
SYR BULB EAR ULCER 3OZ GRN STR (SYRINGE) IMPLANT
SYR CONTROL 10ML LL (SYRINGE) ×2 IMPLANT
TOWEL GREEN STERILE FF (TOWEL DISPOSABLE) ×4 IMPLANT
TUBE CONNECTING 12X1/4 (SUCTIONS) ×2 IMPLANT
TUBING BULK SUCTION (MISCELLANEOUS) ×2 IMPLANT
WATER STERILE IRR 1000ML POUR (IV SOLUTION) ×2 IMPLANT
YANKAUER SUCT BULB TIP NO VENT (SUCTIONS) ×2 IMPLANT

## 2022-08-19 NOTE — Interval H&P Note (Signed)
History and Physical Interval Note:  08/19/2022 8:22 AM  Eric Day  has presented today for surgery, with the diagnosis of EXPOSURE ORBITAL IMPLANT RIGHT EYE.  The various methods of treatment have been discussed with the patient and family. After consideration of risks, benefits and other options for treatment, the patient has consented to  Procedure(s): ORBITAL RECONSTRUCTION (Right) as a surgical intervention.  The patient's history has been reviewed, patient examined, no change in status, stable for surgery.  I have reviewed the patient's chart and labs.  Questions were answered to the patient's satisfaction.     Dairl Ponder

## 2022-08-19 NOTE — Transfer of Care (Signed)
Immediate Anesthesia Transfer of Care Note  Patient: Eric Day  Procedure(s) Performed: ORBITAL RECONSTRUCTION (Right: Eye) Dermis Fat Graft from Right Upper Leg (Right: Leg Upper) RIGHT ANTERIOR ORBITOTOMY WITH BIOPSY (Right: Eye)  Patient Location: PACU  Anesthesia Type:General  Level of Consciousness: drowsy  Airway & Oxygen Therapy: Patient Spontanous Breathing and Patient connected to face mask oxygen  Post-op Assessment: Report given to RN and Post -op Vital signs reviewed and stable  Post vital signs: Reviewed and stable  Last Vitals:  Vitals Value Taken Time  BP 127/82 08/19/22 1204  Temp    Pulse 76 08/19/22 1206  Resp 17 08/19/22 1206  SpO2 97 % 08/19/22 1206  Vitals shown include unvalidated device data.  Last Pain:  Vitals:   08/19/22 0707  TempSrc:   PainSc: 0-No pain         Complications: No notable events documented.

## 2022-08-19 NOTE — Anesthesia Procedure Notes (Signed)
Procedure Name: Intubation Date/Time: 08/19/2022 9:10 AM  Performed by: Caren Macadam, CRNAPre-anesthesia Checklist: Patient identified, Emergency Drugs available, Suction available and Patient being monitored Patient Re-evaluated:Patient Re-evaluated prior to induction Oxygen Delivery Method: Circle system utilized Preoxygenation: Pre-oxygenation with 100% oxygen Induction Type: IV induction Ventilation: Mask ventilation without difficulty Laryngoscope Size: Miller and 2 Grade View: Grade I Tube type: Oral Tube size: 7.5 mm Number of attempts: 1 Airway Equipment and Method: Stylet Placement Confirmation: ETT inserted through vocal cords under direct vision, positive ETCO2 and breath sounds checked- equal and bilateral Secured at: 22 cm Tube secured with: Tape Dental Injury: Teeth and Oropharynx as per pre-operative assessment

## 2022-08-19 NOTE — Op Note (Signed)
Operative Note PATIENT NAME:  Eric Day HOSPITAL: [facility] MRN:  [id number] CSN: [id number] DATE OF SERVICE:  08/19/2022 DATE OF BIRTH:  1960-06-05  PREOPERATIVE DIAGNOSIS:   1. Exposure of orbital implant, right 2. History of enucleation, right 3.  Conjunctival lesion, right 4.  History of neurofibromatosis  POSTOPERATIVE DIAGNOSIS:  same  PROCEDURE(S) PERFORMED:  1. Right anterior orbitotomy with biopsy 67412 2.  Right orbital reconstruction with dermis fat graft from right anterior thigh [id number] and [id number] 3.  Right temporary tarsorrhaphy (temporary closure of eyelids by suture)  SURGEON:  Dairl Ponder, MD, PhD ASSISTANT:  none  ANESTHESIA:  General  FLUIDS GIVEN: Per Anesthesia   ESTIMATED BLOOD LOSS:  < 5 cc  TUBES/DRAINS:  None  SPECIMENS/CULTURES: Right conjunctival lesion  COMPLICATIONS:  None  DESCRIPTION OF PROCEDURE: After obtaining informed consent, the patient was taken back to the operating room and laid supine on the operating room table.  Under monitored anesthesia care, the operative site(s) were anesthetized with 2% lidocaine with epinephrine and bupivicaine.    The patient was prepped and draped in the usual sterile ophthalmic fashion.  Attention was directed to the right anterior thigh.  A 2 x 0.8 cm ellipsoid shaped incision was made through the epidermis using a 15 blade.  The epidermis was then lifted using a sharp dissection.  The epithelium was discarded.  A 15 blade was then used to make an incision in the dermis full-thickness.  Stevens tenotomy scissors were then used to excise a plug of orbital fat along with the dermis.  This graft was then placed in sterile saline.  Attention was then directed to the right orbit.  A lid speculum was placed.  A conjunctival lesion measuring 6 mm was observed and excised.  This was submitted for permanent analysis.  Next, the remaining conjunctiva was gently lifted from the implant using  combination of sharp and blunt dissection with Wescott scissors.  Dissection was carried out in the inferotemporal, inferonasal, and superotemporal and superolateral quadrants.  The 4 rectus muscles were then each hooked with a Stevens muscle hook, and then tagged with double-armed 5-0 Vicryl sutures.  The remaining Tetons were dissected from the orbital implant using combination of sharp and blunt dissection.  The orbit was packed with gauze soaked in lidocaine with epinephrine and bupivacaine.  The gauze was removed.  Bleeding vessels were cauterized using bipolar cautery.  Sterile water was then used to identify additional bleeding vessels and additional bipolar cautery applied.  Graft was then brought into the field and secured to the extraocular muscles in horizontal mattress fashion.  The T9's were then closed with 5-0 micro Vicryl suture in running buried deep fashion.  The conjunctiva was then approximated to the edge of the dermis using 6-0 plain gut suture in running fashion.  A conformer and erythromycin ointment were placed in the socket.  A temporary tarsorrhaphy was then performed by tying 5-0 nylon suture over bolsters approximating the gray line of the upper and lower eyelids.  Additional erythromycin ointment was placed on the lids.  2 eye pads were then taped down and an eye shield.  The right anterior thigh was dressed with bacitracin ointment and a Tegaderm dressing.  A pressure dressing was then applied over the right anterior thigh.  The patient was then taken to the recovery room in stable condition.  The patient tolerated procedure well.  There were no complications.  Dairl Ponder, MD, PhD Ophthalmic Plastic and Reconstructive Surgery  [employer] [phone] (office)

## 2022-08-19 NOTE — Anesthesia Postprocedure Evaluation (Signed)
Anesthesia Post Note  Patient: Eric Day  Procedure(s) Performed: ORBITAL RECONSTRUCTION (Right: Eye) Dermis Fat Graft from Right Upper Leg (Right: Leg Upper) RIGHT ANTERIOR ORBITOTOMY WITH BIOPSY (Right: Eye)     Patient location during evaluation: PACU Anesthesia Type: General Level of consciousness: awake and alert Pain management: pain level controlled Vital Signs Assessment: post-procedure vital signs reviewed and stable Respiratory status: spontaneous breathing, nonlabored ventilation and respiratory function stable Cardiovascular status: blood pressure returned to baseline Postop Assessment: no apparent nausea or vomiting Anesthetic complications: no   No notable events documented.  Last Vitals:  Vitals:   08/19/22 1315 08/19/22 1330  BP: 119/77 119/73  Pulse: 84 73  Resp: 15 17  Temp:  37.1 C  SpO2: 91% 90%    Last Pain:  Vitals:   08/19/22 1330  TempSrc:   PainSc: 0-No pain                 Shanda Howells

## 2022-08-22 ENCOUNTER — Encounter (HOSPITAL_COMMUNITY): Payer: Self-pay | Admitting: Optometry

## 2022-08-22 LAB — SURGICAL PATHOLOGY

## 2022-08-23 DIAGNOSIS — E559 Vitamin D deficiency, unspecified: Secondary | ICD-10-CM | POA: Diagnosis not present

## 2022-08-23 DIAGNOSIS — E2740 Unspecified adrenocortical insufficiency: Secondary | ICD-10-CM | POA: Diagnosis not present

## 2022-08-23 DIAGNOSIS — C7490 Malignant neoplasm of unspecified part of unspecified adrenal gland: Secondary | ICD-10-CM | POA: Diagnosis not present

## 2022-08-31 ENCOUNTER — Ambulatory Visit: Payer: 59 | Admitting: Cardiovascular Disease

## 2022-09-23 DIAGNOSIS — H04203 Unspecified epiphora, bilateral lacrimal glands: Secondary | ICD-10-CM | POA: Diagnosis not present

## 2022-10-05 DIAGNOSIS — C7490 Malignant neoplasm of unspecified part of unspecified adrenal gland: Secondary | ICD-10-CM | POA: Diagnosis not present

## 2022-10-13 DIAGNOSIS — H04563 Stenosis of bilateral lacrimal punctum: Secondary | ICD-10-CM | POA: Diagnosis not present

## 2022-11-01 ENCOUNTER — Encounter: Payer: Self-pay | Admitting: Cardiovascular Disease

## 2022-11-01 ENCOUNTER — Ambulatory Visit: Payer: Medicaid Other | Attending: Cardiovascular Disease | Admitting: Cardiovascular Disease

## 2022-11-01 VITALS — BP 106/68 | HR 92 | Ht 67.0 in | Wt 193.6 lb

## 2022-11-01 DIAGNOSIS — I10 Essential (primary) hypertension: Secondary | ICD-10-CM | POA: Diagnosis present

## 2022-11-01 MED ORDER — AMLODIPINE BESYLATE 2.5 MG PO TABS
2.5000 mg | ORAL_TABLET | Freq: Every day | ORAL | 3 refills | Status: AC
Start: 2022-11-01 — End: ?

## 2022-11-01 MED ORDER — CARVEDILOL 6.25 MG PO TABS
6.2500 mg | ORAL_TABLET | Freq: Two times a day (BID) | ORAL | 3 refills | Status: DC
Start: 2022-11-01 — End: 2023-12-19

## 2022-11-01 NOTE — Progress Notes (Signed)
Cardiology Office Note:    Date:  11/01/2022   ID:  Eric Day, DOB 1960/05/04, MRN [id number]  PCP:  Tally Joe, MD    HeartCare Providers Cardiologist:  Charlott Calvario     Referring MD: Tally Joe, MD   Chief Complaint  Patient presents with   Hypertension    History of Present Illness:     Seen with Hinda Kehr, peer support specialist   Eric Day is a 62 y.o. male with a hx of "cardiac event" Had a cath (   Dr. Candace Cruise) in 2006.  He was found to have normal coronary arteries at that time.  It was thought that the myocardial infarction was due to hypertensive non-ST segment elevation myocardial infarction.  Still smokes   Neurofibromatosis   He has had an adrenal tumor removed.  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  He is followed at [facility].  He still secretes excess metanephrines.  Lipid levels on the K PN reveal a total cholesterol of 179 The HDL is 34 LDL is 132 Triglyceride level is 66  November 01, 2022:  Eric Day is seen today for follow-up visit.  Seen with caretaker, Bland Span  Has some upper abdominal pain , lower chest pain  Sometimes after eating  Is not worsened by walking      Past Medical History:  Diagnosis Date   Arthritis    Colon cancer (HCC)    prostate; history of stomach cancer    Colon cancer [facility])    Depression    ED (erectile dysfunction)    Eye disorder    rt artificial   Hard of hearing    bilat    Hypertension    Legally blind    Multiple falls    Myocardial infarction [facility])    2008   Neurofibromatosis (HCC)    Nocturia    Prosthetic eye globe    right eye    Shortness of breath dyspnea    walking distances or running    Tuberculosis    70's   Wears glasses     Past Surgical History:  Procedure Laterality Date   ABDOMINAL SURGERY  90's   stomach?   ANTERIOR CERVICAL DECOMP/DISCECTOMY FUSION N/A 07/07/2014   Procedure: ANTERIOR CERVICAL DECOMPRESSION/DISCECTOMY FUSION CERVICAL THREE-FOUR;   Surgeon: Donalee Citrin, MD;  Location: [facility];  Service: Neurosurgery;  Laterality: N/A;   CARDIAC CATHETERIZATION     EYE SURGERY  90's   HERNIA REPAIR     hernia as child   ORIF ORBITAL FRACTURE Right 08/19/2022   Procedure: ORBITAL RECONSTRUCTION;  Surgeon: Dairl Ponder, MD;  Location: [facility] OR;  Service: Ophthalmology;  Laterality: Right;   ROBOT ASSISTED LAPAROSCOPIC RADICAL PROSTATECTOMY N/A 05/07/2015   Procedure: ROBOTIC ASSISTED LAPAROSCOPIC RADICAL PROSTATECTOMY  LEVEL 3 LYSIS OF ADHESIONS/SMALL BOWEL RESECTION AND ANASTAMOSIS;  Surgeon: Heloise Purpura, MD;  Location: [facility] ORS;  Service: Urology;  Laterality: N/A;   SKIN BIOPSY Right 08/19/2022   Procedure: RIGHT ANTERIOR ORBITOTOMY WITH BIOPSY;  Surgeon: Dairl Ponder, MD;  Location: [facility] OR;  Service: Ophthalmology;  Laterality: Right;   SKIN FULL THICKNESS GRAFT Right 08/19/2022   Procedure: Dermis Fat Graft from Right Upper Leg;  Surgeon: Dairl Ponder, MD;  Location: [facility] OR;  Service: Ophthalmology;  Laterality: Right;    Current Medications: Current Meds  Medication Sig   amLODipine (NORVASC) 2.5 MG tablet Take 1 tablet (2.5 mg total) by mouth daily.   calcium carbonate (TUMS - DOSED IN MG  ELEMENTAL CALCIUM) 500 MG chewable tablet Chew 1 tablet by mouth daily.   carvedilol (COREG) 6.25 MG tablet Take 1 tablet (6.25 mg total) by mouth 2 (two) times daily.   Cholecalciferol 50 MCG (2000 UT) TABS Take by mouth.   fludrocortisone (FLORINEF) 0.1 MG tablet Take by mouth.   hydrocortisone (CORTEF) 5 MG tablet Take 5 mg by mouth 2 (two) times daily.   Omega-3 Fatty Acids (FISH OIL PO) Take 1 capsule by mouth daily.   VITAMIN D PO Take 1 capsule by mouth once a week.   [DISCONTINUED] carvedilol (COREG) 3.125 MG tablet Take 1 tablet (3.125 mg total) by mouth 2 (two) times daily with a meal.   [DISCONTINUED] valsartan (DIOVAN) 80 MG tablet Take 1 tablet (80 mg total) by mouth daily.     Allergies:   Patient has no known allergies.    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   Sexual activity: Not on file  Other Topics Concern   Not on file  Social History Narrative   Not on file   Social Determinants of Health   Financial Resource Strain: Low Risk  (09/17/2019)   Received from [facility], [facility]   Overall Financial Resource Strain (CARDIA)    Difficulty of Paying Living Expenses: Not hard at all  Food Insecurity: No Food Insecurity (09/17/2019)   Received from [facility], [facility]   Hunger Vital Sign    Worried About Running Out of Food in the Last Year: Never true    Ran Out of Food in the Last Year: Never true  Transportation Needs: No Transportation Needs (09/17/2019)   Received [facility], [facility]   [facility] - Transportation    In the past 12 months, has lack of transportation kept you from medical appointments or from getting medications?: No    Lack of Transportation (Non-Medical): No  Physical Activity: Insufficiently Active (09/17/2019)   Received from [facility], [facility]   Exercise Vital Sign    Days of Exercise per Week: 2 days    Minutes of Exercise per Session: 20 min  Stress: Stress Concern Present (09/17/2019)   Received from [facility], [facility]   [employer] of Occupational [facility] - Occupational Stress Questionnaire    Feeling of Stress : To some extent  Social Connections: Not on file     Family History: The patient's family history includes Diabetes in his brother; Hypertension in his father and mother; Lung cancer in his father.  ROS:   Please see the history of present illness.     All other systems reviewed and are negative.  EKGs/Labs/Other  Studies Reviewed:    The following studies were reviewed today:   EKG:    EKG Interpretation Date/Time:  Tuesday November 01 2022 15:23:24 EDT Ventricular Rate:  92 PR Interval:  158 QRS Duration:  126 QT Interval:  374 QTC Calculation: 462 R Axis:   -80  Text Interpretation: Sinus rhythm with frequent Premature ventricular complexes Left axis deviation Right bundle branch block Inferior infarct (cited on or before 28-Jun-2014) When compared with ECG of 28-Jun-2014 15:19, Premature ventricular complexes are now Present Right bundle branch block is now Present Confirmed by Kristeen Miss ([id number]) on 11/01/2022 3:39:02 PM    Recent Labs: 08/19/2022: BUN 9; Creatinine, Ser 0.99; Hemoglobin 15.7; Platelets 229; Potassium 4.6; Sodium 136  Recent Lipid Panel No results found for: "CHOL", "TRIG", "HDL", "CHOLHDL", "VLDL", "LDLCALC", "LDLDIRECT"   Risk Assessment/Calculations:         EKG Interpretation Date/Time:  Tuesday November 01 2022 15:23:24 EDT Ventricular Rate:  92 PR Interval:  158 QRS Duration:  126 QT Interval:  374 QTC Calculation: 462 R Axis:   -80  Text Interpretation: Sinus rhythm with frequent Premature ventricular complexes Left axis deviation Right bundle branch block Inferior infarct (cited on or before 28-Jun-2014) When compared with ECG of 28-Jun-2014 15:19, Premature ventricular complexes are now Present Right bundle branch block is now Present Confirmed by Kristeen Miss ([id number]) on 11/01/2022 3:39:02 PM          Physical Exam:    Physical Exam: Blood pressure 106/68, pulse 92, height 5\' 7"  (1.702 m), weight 193 lb 9.6 oz (87.8 kg), SpO2 98%.       GEN:  Well nourished, well developed in no acute distress HEENT: Normal NECK: No JVD; No carotid bruits LYMPHATICS: No lymphadenopathy CARDIAC: RRR , no murmurs, rubs, gallops RESPIRATORY:  Clear to auscultation without rales, wheezing or rhonchi  ABDOMEN: Soft, non-tender, non-distended MUSCULOSKELETAL:  No edema; No deformity  SKIN: Warm and dry NEUROLOGIC:  Alert and oriented x 3   ASSESSMENT:    1. Primary hypertension    PLAN:       Old inferior wall myocardial infarction: Patient had what was called a coronary event in 2006.  Heart catheterization at that time did not reveal any significant coronary artery disease and it was thought that the myocardial infarction was due to hypertensive emergency.  He has had inferior Q waves noted on his EKG going back to 2016.   2.  Chronic systolic congestive heart failure: His echocardiogram from September, 2023 revealed borderline low left ventricular systolic function.  I put him on valsartan to treat his chronic systolic congestive heart failure.  His endocrinologist at [facility] would rather him be on amlodipine instead of valsartan because of the   beneficial effects  on the pheochromocytoma.  I will yield to endocrinology on this.  His LV function is not overtly depressed and I do not necessarily think that it is absolutely necessary that we have him on an ARB.  Will increase the carvedilol to 6.25 mg twice a day, stop valsartan, add amlodipine 2.5 mg a day.  His blood pressure today is on the low side-106/68.  Bland Span knows to alert his brother about watching for any hypotension.  If he becomes hypotensive I would suggest that we hold his amlodipine but again I will yield to Dr. [place] Callas at [facility] for fine-tuning of his blood pressure in the setting of pheochromocytoma.    2.  Right bundle  branch block.     3.  Hypertension:    Pheochromocytoma: He is status post removal of 1 adrenal gland.  He still has some residual pheochromocytoma/metanephrine production.  He is followed at [facility].  He sees Dr. [place] Callas at [facility] She would prefer that he be on Coreg and Amlodipine for his Pheo.   This is fine with me. His BP is borderline today . He will watch for hypotension . Further management per Dr. [place] Callas       Shared Decision Making/Informed Consent{ arrhythmias, dizziness, blood pressure fluctuations, myocardial infarction, stroke/transient ischemic attack, nausea, vomiting, allergic reaction, radiation exposure, metallic taste sensation and life-threatening complications (estimated to be 1 in 10,000)], benefits (risk stratification, diagnosing coronary artery disease, treatment guidance) and alternatives of a nuclear stress test were discussed in detail with Mr. Horsch and he agrees to proceed.    Medication Adjustments/Labs and Tests Ordered: Current medicines are reviewed at length with the patient today.  Concerns regarding medicines are outlined above.  Orders Placed This Encounter  Procedures   EKG 12-Lead   Meds ordered this encounter  Medications   carvedilol (COREG) 6.25 MG tablet    Sig: Take 1 tablet (6.25 mg total) by mouth 2 (two) times daily.    Dispense:  180 tablet    Refill:  3    Dose INCREASE   amLODipine (NORVASC) 2.5 MG tablet    Sig: Take 1 tablet (2.5 mg total) by mouth daily.    Dispense:  90 tablet    Refill:  3     Patient Instructions  Medication Instructions:  INCREASE Coreg (Carvedilol) to 6.25 twice daily STOP Valsartan START Amlodipine 2.5mg  daily *If you need a refill on your cardiac medications before your next appointment, please call your pharmacy*   Lab Work: NONE If you have labs (blood work) drawn today and your tests are completely normal, you will receive your results only by: MyChart Message (if you have MyChart) OR A paper  copy in the mail If you have any lab test that is abnormal or we need to change your treatment, we will call you to review the results.   Testing/Procedures: NONE   Follow-Up: At [facility], you and your health needs are our priority.  As part of our continuing mission to provide you with exceptional heart care, we have created designated Provider Care Teams.  These Care Teams include your primary Cardiologist (physician) and Advanced Practice Providers (APPs -  Physician Assistants and Nurse Practitioners) who all work together to provide you with the care you need, when you need it.  Your next appointment:   1 year(s)  Provider:   Kristeen Miss, MD        Signed, Kristeen Miss, MD  11/01/2022 5:51 PM    [facility]

## 2022-11-01 NOTE — Patient Instructions (Signed)
Medication Instructions:  INCREASE Coreg (Carvedilol) to 6.25 twice daily STOP Valsartan START Amlodipine 2.5mg  daily *If you need a refill on your cardiac medications before your next appointment, please call your pharmacy*   Lab Work: NONE If you have labs (blood work) drawn today and your tests are completely normal, you will receive your results only by: MyChart Message (if you have MyChart) OR A paper copy in the mail If you have any lab test that is abnormal or we need to change your treatment, we will call you to review the results.   Testing/Procedures: NONE   Follow-Up: At [facility], you and your health needs are our priority.  As part of our continuing mission to provide you with exceptional heart care, we have created designated Provider Care Teams.  These Care Teams include your primary Cardiologist (physician) and Advanced Practice Providers (APPs -  Physician Assistants and Nurse Practitioners) who all work together to provide you with the care you need, when you need it.  Your next appointment:   1 year(s)  Provider:   Kristeen Miss, MD

## 2023-01-06 IMAGING — CT CT CERVICAL SPINE W/O CM
4 of 5 series · 13 of 33 positions shown, 15 images · non-contrast
Comparison: MRI cervical spine 04/24/2020

CLINICAL DATA: Neck pain.  History of prior C3-4 fusion.

EXAM:
CT CERVICAL SPINE WITHOUT CONTRAST
TECHNIQUE: Multidetector CT imaging of the cervical spine was performed without
intravenous contrast. Multiplanar CT image reconstructions were also
generated.

[Series 4: c-spine 2.00 br40 s3 axial (person_name) · axial · 0.29mm/px · z∈[-817,-767]mm · 2 of 100 slices shown]
[im 25/100  bone]
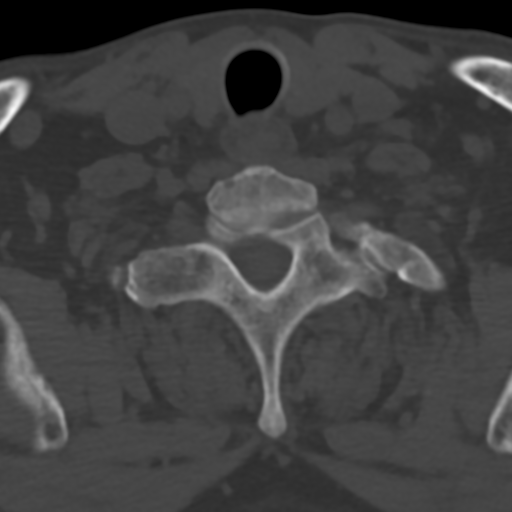
[im 50/100  bone]
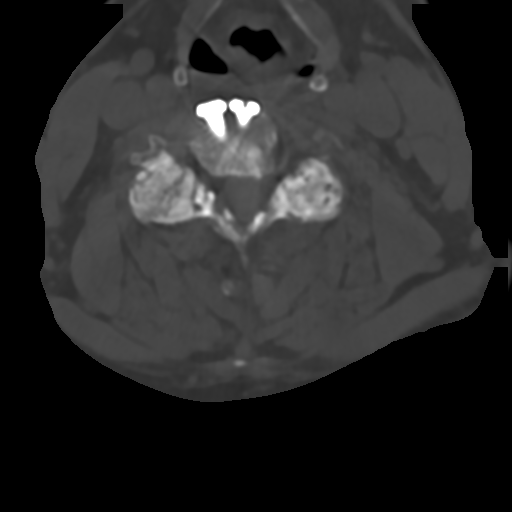

[Series 5: c-spine 2.00 br60 s3 sag bone · sagittal · 0.29mm/px · 5 of 74 slices shown, 6 images]
[im 25/74  bone]
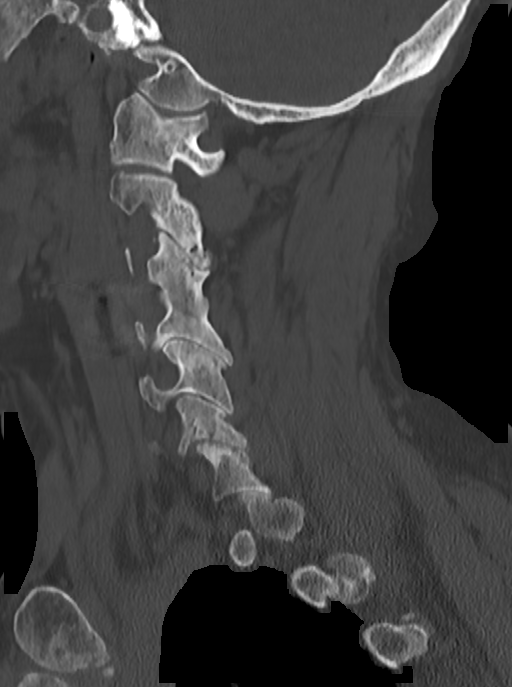
[im 31/74  bone]
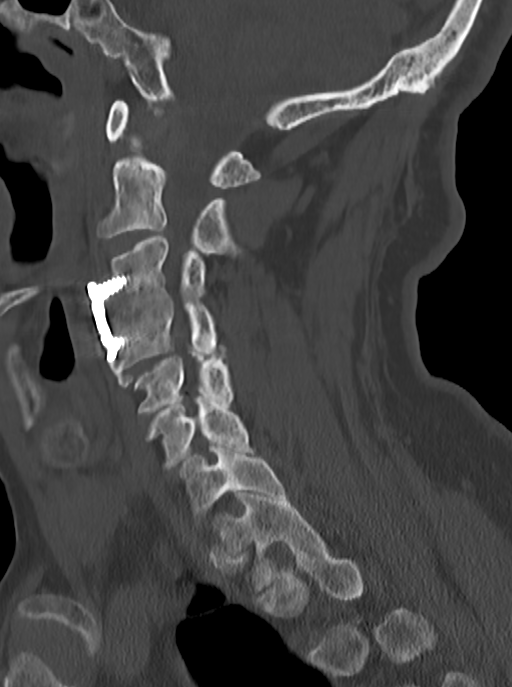
[im 37/74  soft-tissue]
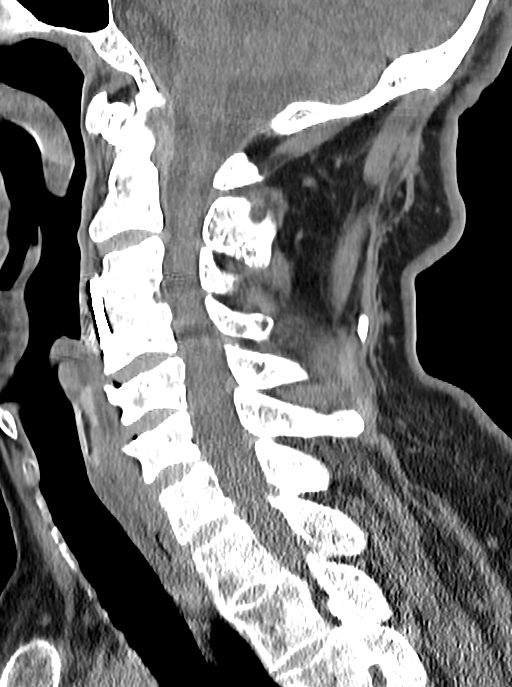
[im 37/74  bone]
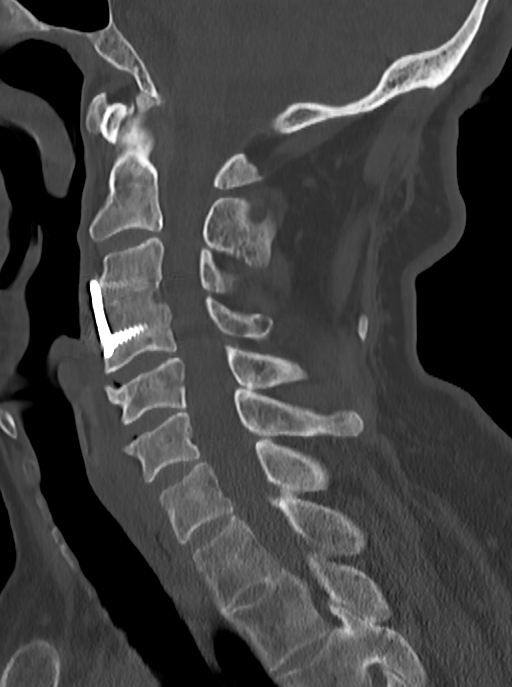
[im 43/74  bone]
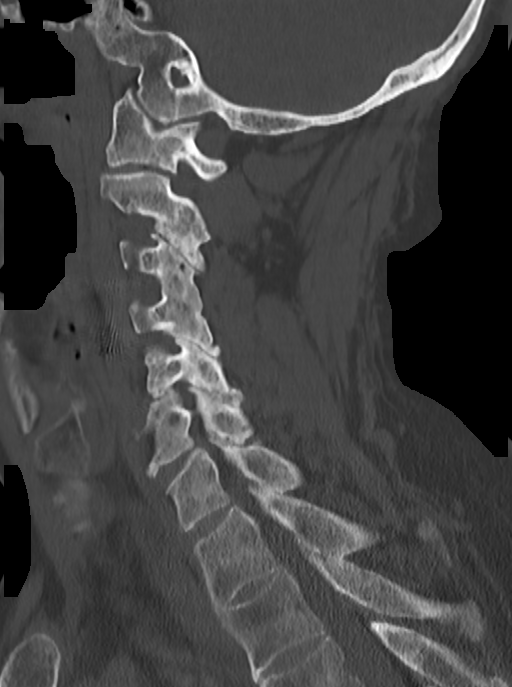
[im 49/74  bone]
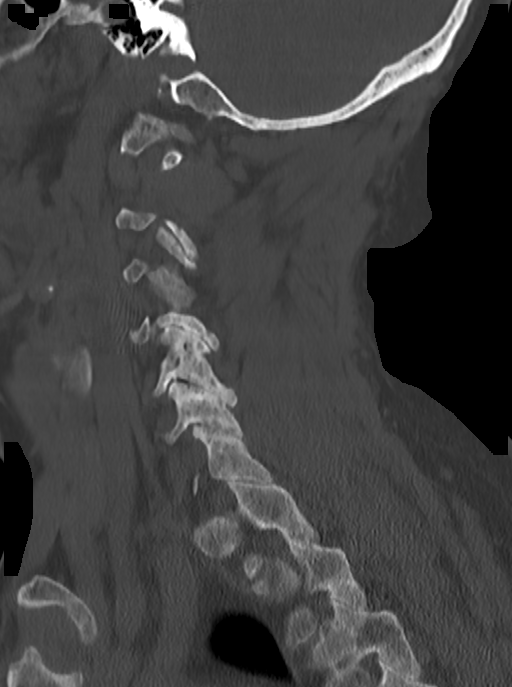

[Series 7: c-spine 2.00 hr60 s3 cor bone · coronal · 0.29mm/px · 3 of 75 slices shown]
[im 15/75  bone]
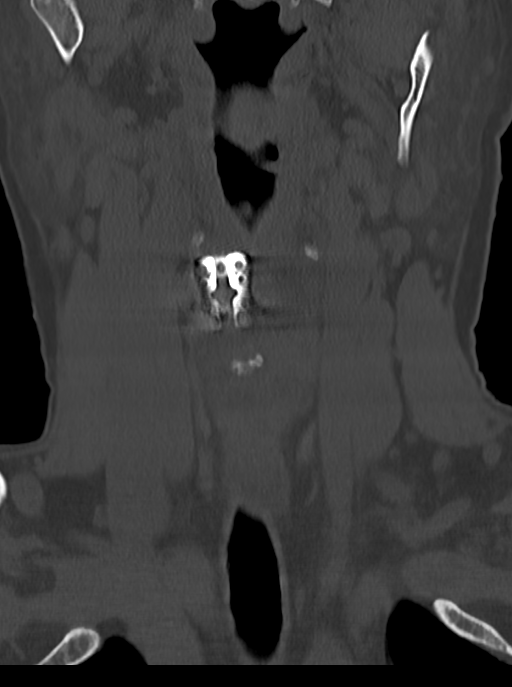
[im 30/75  bone]
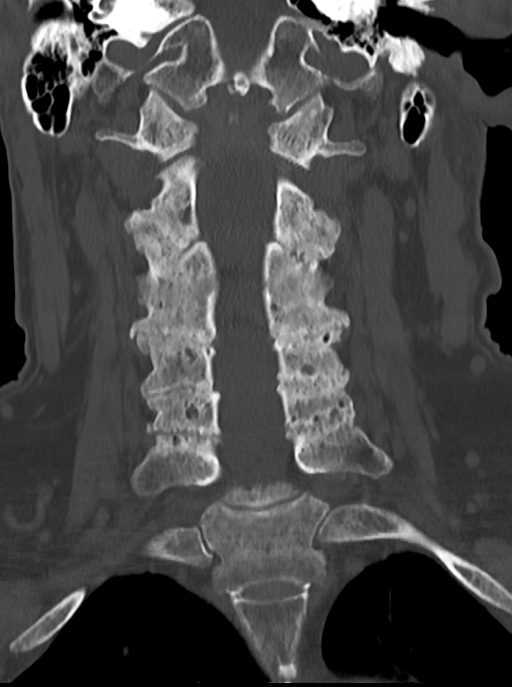
[im 45/75  bone]
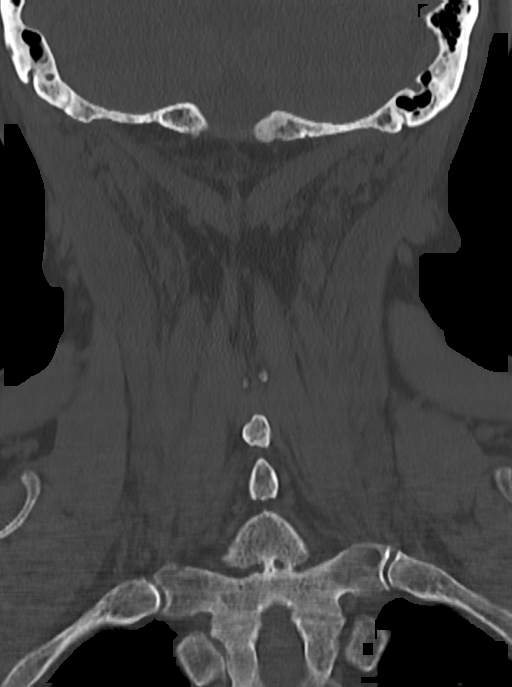

[Series 10: c-spine 2.00 hr60 s3 orthogonal axial · axial · 0.29mm/px · z∈[-851,-742]mm · 3 of 96 slices shown, 4 images]
[im 24/96  soft-tissue]
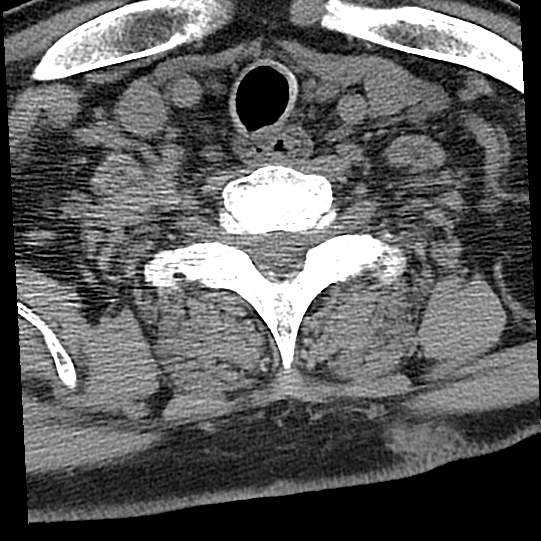
[im 24/96  bone]
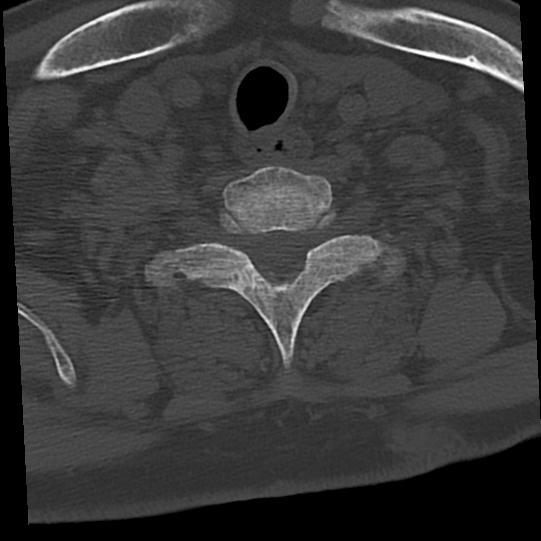
[im 48/96  bone]
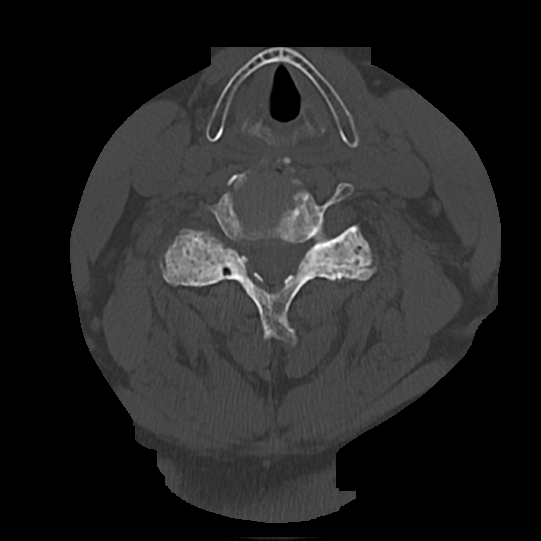
[im 72/96  bone]
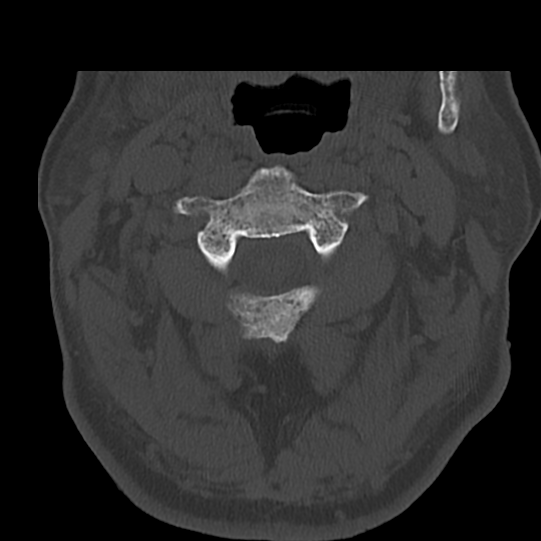

[13 of 33 positions shown; findings below may reference images not displayed]

FINDINGS: Alignment: Normal

Skull base and vertebrae: No acute bony findings. No fractures or
worrisome bone lesions. Benign appearing C[DATE] angioma.

Cervical fusion changes at C3-4 with anterior and interbody fusion.
The interbody fusion is solid. The facets are also fused at this
level.

Almost all of the facets are fused or near completely fused all the
way down to T1-2. Does this patient have an inflammatory/erosive
arthropathy?

Soft tissues and spinal canal: No prevertebral fluid or swelling. No
visible canal hematoma.

Disc levels: C2-3: Advanced facet disease but no disc protrusions,
spinal or foraminal stenosis.

C3-4: Anterior and interbody fusion. The interbody fusion is solid.
The facets are fused bilaterally. No significant spinal or foraminal
stenosis. Mild posterior osteophytic ridging.

C4-5: Fused facet joints but no obvious disc protrusion, spinal or
foraminal stenosis. Mild foraminal narrowing bilaterally.

C5-6: Fused facet joints. No obvious disc protrusions or spinal
stenosis. Mild left foraminal narrowing.

C6-7: Fused facet joints but no significant spinal or foraminal
stenosis.

C7-T1: No significant findings.

Upper chest: The lung apices are grossly clear.

Other: No neck mass or adenopathy.
IMPRESSION: 1. Anterior and interbody fusion changes at C3-4. The interbody
fusion is solid.
2. Almost all of the facets are fused or near completely fused all
the way down to T1-2. Does this patient have an inflammatory/erosive
arthropathy? Of
3. Mild foraminal narrowing bilaterally at C4-5. Mild left foraminal
narrowing at C5-6.

## 2023-06-22 ENCOUNTER — Ambulatory Visit: Payer: Medicaid Other | Attending: Family Medicine | Admitting: Audiologist

## 2023-06-22 DIAGNOSIS — H905 Unspecified sensorineural hearing loss: Secondary | ICD-10-CM | POA: Diagnosis present

## 2023-06-22 DIAGNOSIS — Q8502 Neurofibromatosis, type 2: Secondary | ICD-10-CM | POA: Insufficient documentation

## 2023-06-22 NOTE — Procedures (Signed)
  Outpatient Audiology and [facility] [address] [place], [place]  [address] [phone]  AUDIOLOGICAL  EVALUATION  NAME: Eric Day     DOB:   1960-10-02      MRN: [id number]                                                                                     DATE: 06/22/2023     REFERENT: Tally Joe, MD STATUS: Outpatient DIAGNOSIS: Profound Hearing Loss, Neurofibromatosis II  History: Tyrus was seen for an audiological evaluation due to difficulty hearing after losing a hearing aid. Chick was accompanied by his advocate Bland Span who assisted in case history.  Geral has [employer] BTE hearing aids. He has had trouble finding a medicaid provider that will help him obtain a new right hearing aid. Bland Span says he got his current aids in [place] somewhere. This was more than a year ago. 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 Karma is dependent on his hearing aids to help him stay independent and mobile.   Evaluation:  Otoscopy showed a clear view of the tympanic membranes, bilaterally Tympanometry results were consistent with hypercompliance in right ear and normal function in left ear Audiometric testing was completed using Conventional Audiometry techniques with insert earphones. Test results are consistent with profound sensorineural hearing loss bilaterally. Speech Recognition Thresholds were obtained at  80dB HL in the right ear and at  80dB HL in the left ear. Word Recognition Testing was completed at  110dB and Fayrene Fearing scored  48% in the left ear and 64% in the right ear.   Results:  The test results were reviewed with Fayrene Fearing and [place]. [place] has profound loss in both ears. He understands speech better in the left ear. Chip needs to follow up with his original hearing aid provider, and have an evaluation for possible cochlear implant candidacy. Recommend they call [facility] as this is likely who fit him the first time.  Audiogram printed  and provided to [place].      Recommendations: Hearing aids recommended for both ears. Also recommend consultation for cochlear implants.  Tally Joe, MD: Please refer to the main [facility] ([facility]) for Otolaryngology and Audiology appointments for cochlear implant candidacy and hearing aid replacement.  Fax: [phone]   38 minutes spent testing and counseling on results.   If you have any questions please feel free to contact me at [phone].  Brendia Sacks BS  Kenda Kloehn Au.D.  Audiologist   06/22/2023  3:38 PM  Cc: Tally Joe, MD

## 2023-07-25 ENCOUNTER — Other Ambulatory Visit: Payer: Self-pay | Admitting: Surgery

## 2023-08-17 NOTE — Progress Notes (Addendum)
 Reviewed chart with Dr. Claudio Culver. Pt needs cardiac clearance but otherwise ok for day surgery. Left message with [facility] scheduler, Cornelius Dill.

## 2023-08-21 ENCOUNTER — Encounter (HOSPITAL_BASED_OUTPATIENT_CLINIC_OR_DEPARTMENT_OTHER): Payer: Self-pay | Admitting: Surgery

## 2023-08-21 NOTE — Progress Notes (Signed)
 Reviewed patients hx of pheochromocytoma and per Dr. Jarrell Merritts patient is not a day surgery center candidate. Moved to main OR. Dr. Arvella Bird office aware

## 2023-08-22 ENCOUNTER — Encounter (HOSPITAL_COMMUNITY): Payer: Self-pay | Admitting: Surgery

## 2023-08-22 NOTE — H&P (Signed)
 REFERRING PHYSICIAN: Azalia Bolk, * PROVIDER: Debi Fall, MD MRN: [id number] DOB: 20-May-1960  Subjective   Chief Complaint: New Consultation  History of Present Illness: Eric Day is a 63 y.o. male who is seen today as an office consultation for evaluation of New Consultation  This is a 63 year old gentleman with familial neurofibromatosis who has a history of having had multiple neurofibromas removed from his back and arms. He still has multiple neurofibromas but 1 on his left upper back is causing increasing discomfort especially with physical activity. There are also 2 on his posterior scalp and one on the upper neck that cause discomfort especially when wearing a hat with the close and have rubbing on them and causing irritation. He is otherwise without complaints.  Review of Systems: A complete review of systems was obtained from the patient. I have reviewed this information and discussed as appropriate with the patient. See HPI as well for other ROS.  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 (CMS/HHS-HCC)  Hypoxia  Neurofibromatosis, type 1 (CMS/HHS-HCC)  Tobacco use disorder  Blindness  Partial deafness  Adrenal insufficiency (CMS/HHS-HCC)  Pheochromocytoma, malignant, unspecified laterality (CMS/HHS-HCC)  History of myocardial infarction  Asthma, unspecified asthma severity, unspecified  whether complicated, unspecified whether persistent (HHS-HCC)  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)   Past Surgical History:  Procedure Laterality Date  prosthetic eye Right 1990  COLECTOMY PARTIAL W/ANASTAMOSIS Right 2006  per pt report completed at [facility] in [place]  SPINE SURGERY 2019  ACDF  ADRENAL GLAND SURGERY 04/2019  Removal due to tumor  ADRENALECTOMY Right 05/14/2019  Procedure: ADRENALECTOMY, PARTIAL OR COMPLETE, OR EXPLORATION OF ADRENAL GLAND WITH OR WITHOUT BIOPSY, TRANSABDOMINAL, LUMBAR OR DORSAL (SEPARATE PROCEDURE) pheochromocytoma; Surgeon: Delmer Ferraris, MD; Location: [facility] OR; Service: General Surgery; Laterality: Right;  CHOLECYSTECTOMY N/A 05/14/2019  Procedure: CHOLECYSTECTOMY; Surgeon: Glenford Lanes, MD; Location: [facility] OR; Service: General Surgery; Laterality: N/A;  LYSIS ADHESIONS N/A 05/14/2019  Procedure: ENTEROLYSIS (FREEING OF INTESTINAL ADHESION) (SEPARATE PROCEDURE); Surgeon: Glenford Lanes, MD; Location[facility] OR; Service: General Surgery; Laterality: N/A;  ORBITAL EYE SURGERY Right 08/19/2022  HERNIA REPAIR  umbilical hernias and inguinal hernia repairs  Prostate surgery  STOMACH SURGERY  possible partial gastrectomy    Allergies  Allergen Reactions  Corticosteroids (Glucocorticoids) Other (See Comments)   Current Outpatient Medications on File Prior to Visit  Medication Sig Dispense Refill  acetaminophen  (TYLENOL ) 500 MG tablet Take by mouth  amLODIPine  (NORVASC ) 2.5 MG tablet Take 2.5 mg by mouth once daily  carvediloL  (COREG ) 6.25 MG tablet Take 1 tablet (6.25 mg total) by mouth 2 (two) times daily with meals 180 tablet 1  cholecalciferol (VITAMIN D3) 2,000 unit tablet Take 1 tablet (2,000 Units total) by mouth once daily 30 tablet 11  fludrocortisone (FLORINEF) 0.1 mg tablet Take 0.5 tablets (0.05 mg total) by mouth once daily 45 tablet 3  hydrocortisone (CORTEF) 5 MG  tablet TAKE 1 TABLET BY MOUTH EVERY MORNING AND 1 EVERY EVENING WITH EXTRA FOR  SICK DAYS 250 tablet 3  amLODIPine  (NORVASC ) 5 MG tablet Take 1 tablet (5 mg total) by mouth once daily (Patient not taking: Reported on 07/25/2023) 90 tablet 1  calcium carbonate 500 mg calcium (1,250 mg) tablet Take 1 tablet (1,250 mg of salt total) by mouth 2 (two) times daily with meals (Patient not taking: Reported on 07/25/2023) 180 tablet 1  carvediloL  (COREG ) 3.125 MG tablet Take 3.125 mg by mouth 2 (two) times daily with meals (Patient not taking: Reported on 07/25/2023)  ergocalciferol, vitamin D2, 1,250 mcg (50,000 unit) capsule TAKE 1 CAPSULE BY MOUTH 1 TIME A WEEK FOR 8 DOSES (Patient not taking: Reported on 07/25/2023) 12 capsule 2  MYRBETRIQ 50 mg ER tablet Take 50 mg by mouth once daily (Patient not taking: Reported on 07/25/2023)  omega 3-dha-epa-fish oil (FISH OIL) 1,000 mg (120 mg-180 mg) Cap Take by mouth (Patient not taking: Reported on 07/25/2023)  valsartan  (DIOVAN ) 80 MG tablet Take 80 mg by mouth once daily (Patient not taking: Reported on 07/25/2023)   No current facility-administered medications on file prior to visit.   Family History  Problem Relation Age of Onset  Myocardial Infarction (Heart attack) Mother  Diabetes Mother  Neurofibromatosis Mother  Lung cancer Father  Brain cancer Brother  Brain cancer Brother  Neurofibromatosis Sister  Anesthesia problems Neg Hx    Social History   Tobacco Use  Smoking Status Every Day  Current packs/day: 0.00  Average packs/day: 0.3 packs/day for 30.0 years (7.5 ttl pk-yrs)  Types: Cigarettes  Start date: 05/28/1989  Last attempt to quit: 05/29/2019  Years since quitting: 4.1  Passive exposure: Current  Smokeless Tobacco Never    Social History   Socioeconomic History  Marital status: Single  Tobacco Use  Smoking status: Every Day  Current packs/day: 0.00  Average packs/day: 0.3 packs/day for 30.0 years (7.5 ttl pk-yrs)  Types: Cigarettes   Start date: 05/28/1989  Last attempt to quit: 05/29/2019  Years since quitting: 4.1  Passive exposure: Current  Smokeless tobacco: Never  Vaping Use  Vaping status: Never Used  Substance and Sexual Activity  Alcohol use: Not Currently  Alcohol/week: 0.0 standard drinks of alcohol  Drug use: Not Currently  Comment: Former user, no use x 20 years, unclear which substances  Sexual activity: Yes  Partners: Female  Birth control/protection: Condom, None   Social Drivers of Corporate investment banker Strain: Low Risk (09/17/2019)  Overall Financial Resource Strain (CARDIA)  Difficulty of Paying Living Expenses: Not hard at all  Food Insecurity: No Food Insecurity (09/17/2019)  Hunger Vital Sign  Worried About Running Out of Food in the Last Year: Never true  Ran Out of Food in the Last Year: Never true  Transportation Needs: No Transportation Needs (09/17/2019)  PRAPARE - Risk analyst (Medical): No  Lack of Transportation (Non-Medical): No  Physical Activity: Insufficiently Active (09/17/2019)  Exercise Vital Sign  Days of Exercise per Week: 2 days  Minutes of Exercise per Session: 20 min  Stress: Stress Concern Present (09/17/2019)  [employer] of Occupational Health - Occupational Stress Questionnaire  Feeling of Stress : To some extent   Objective:   Vitals:   BP: 121/77  Pulse: 77  Temp: 36.8 C (98.2 F)  SpO2: 98%  Weight: 88.5 kg (195 lb)  Height: 167.6 cm (5\' 6" )  PainSc: 2  PainLoc: Neck   Body mass index is 31.47 kg/m.  Physical Exam   He appears well on exam.  On his  left upper back near the shoulder there is an old scar and underneath there is a 3 cm mass consistent with a neurofibromatosis.  He has extensive fibromas on his scalp. There are 2 on the posterior scalp that is causing irritation. One is 3 cm and the other is 2 cm.  There is also a 1 cm fibroma on the posterior neck   Labs, Imaging and Diagnostic Testing: I have  reviewed his notes in the electronic medical records.  Assessment and Plan:   Diagnoses and all orders for this visit:  Neurofibroma of back  Neurofibroma of scalp   He has an extensive history of neurofibromas which had been removed in the past when they become symptomatic. He still has multiple skin lesions. He is here today because of the 4 that are bothering him with 2 on the posterior scalp, 1 on the posterior neck, and another on the left upper back. These cause significant comfort when he wears a hat and with clothing irritating the neurofibromas as well as discomfort in his upper shoulder from the 1 on the left upper back. 1 on the back does cause discomfort when he is working at his job. We discussed removal of these. He does not currently have symptoms from his other neurofibromas which are diffuse on his body. We will plan on removing these for because of their symptomatology. I explained the surgical teacher in detail. We discussed the risks which includes but is not limited to bleeding, infection, and recurrence. He understands and wished to proceed with surgery which will be scheduled

## 2023-08-23 ENCOUNTER — Ambulatory Visit (HOSPITAL_COMMUNITY): Payer: Self-pay | Admitting: Anesthesiology

## 2023-08-23 ENCOUNTER — Other Ambulatory Visit: Payer: Self-pay

## 2023-08-23 ENCOUNTER — Ambulatory Visit (HOSPITAL_COMMUNITY)
Admission: RE | Admit: 2023-08-23 | Discharge: 2023-08-23 | Disposition: A | Discharge status: Home / Self Care | Attending: Surgery | Admitting: Surgery

## 2023-08-23 ENCOUNTER — Ambulatory Visit (HOSPITAL_BASED_OUTPATIENT_CLINIC_OR_DEPARTMENT_OTHER): Payer: Self-pay | Admitting: Anesthesiology

## 2023-08-23 ENCOUNTER — Encounter (HOSPITAL_COMMUNITY): Payer: Self-pay | Admitting: Surgery

## 2023-08-23 ENCOUNTER — Encounter (HOSPITAL_COMMUNITY)
Admission: RE | Disposition: A | Discharge status: Home / Self Care | Payer: Self-pay | Source: Home / Self Care | Attending: Surgery

## 2023-08-23 DIAGNOSIS — Z01818 Encounter for other preprocedural examination: Secondary | ICD-10-CM

## 2023-08-23 DIAGNOSIS — Z79899 Other long term (current) drug therapy: Secondary | ICD-10-CM | POA: Diagnosis not present

## 2023-08-23 DIAGNOSIS — I252 Old myocardial infarction: Secondary | ICD-10-CM | POA: Insufficient documentation

## 2023-08-23 DIAGNOSIS — M1990 Unspecified osteoarthritis, unspecified site: Secondary | ICD-10-CM | POA: Diagnosis not present

## 2023-08-23 DIAGNOSIS — Q8509 Other neurofibromatosis: Secondary | ICD-10-CM | POA: Diagnosis not present

## 2023-08-23 DIAGNOSIS — I10 Essential (primary) hypertension: Secondary | ICD-10-CM | POA: Diagnosis not present

## 2023-08-23 DIAGNOSIS — Q8500 Neurofibromatosis, unspecified: Secondary | ICD-10-CM | POA: Diagnosis present

## 2023-08-23 DIAGNOSIS — F17210 Nicotine dependence, cigarettes, uncomplicated: Secondary | ICD-10-CM

## 2023-08-23 HISTORY — PX: EXCISION, MASS, UPPER EXTREMITY: SHX7567

## 2023-08-23 HISTORY — PX: EXCISION MASS NECK: SHX6703

## 2023-08-23 HISTORY — PX: EXCISION OF BACK LESION: SHX6597

## 2023-08-23 SURGERY — EXCISION, MASS, NECK
Anesthesia: General | Site: Neck

## 2023-08-23 MED ORDER — CHLORHEXIDINE GLUCONATE CLOTH 2 % EX PADS: 6.0000 | MEDICATED_PAD | Freq: Once | CUTANEOUS | Status: DC

## 2023-08-23 MED ORDER — ONDANSETRON HCL 4 MG/2ML IJ SOLN
INTRAMUSCULAR | Status: AC
  Filled 2023-08-23: qty 2

## 2023-08-23 MED ORDER — ROCURONIUM BROMIDE 10 MG/ML (PF) SYRINGE
PREFILLED_SYRINGE | INTRAVENOUS | Status: AC
  Filled 2023-08-23: qty 10

## 2023-08-23 MED ORDER — ONDANSETRON HCL 4 MG/2ML IJ SOLN: 4.0000 mg | Freq: Once | INTRAMUSCULAR | Status: DC | PRN

## 2023-08-23 MED ORDER — SUGAMMADEX SODIUM 200 MG/2ML IV SOLN
INTRAVENOUS | Status: DC | PRN
  Administered 2023-08-23: 180 mg via INTRAVENOUS

## 2023-08-23 MED ORDER — CEFAZOLIN SODIUM-DEXTROSE 2-4 GM/100ML-% IV SOLN
2.0000 g | INTRAVENOUS | Status: AC
  Administered 2023-08-23: 2 g via INTRAVENOUS
  Filled 2023-08-23: qty 100

## 2023-08-23 MED ORDER — ACETAMINOPHEN 500 MG PO TABS: 1000.0000 mg | ORAL_TABLET | Freq: Once | ORAL | Status: DC

## 2023-08-23 MED ORDER — ORAL CARE MOUTH RINSE: 15.0000 mL | Freq: Once | OROMUCOSAL | Status: AC

## 2023-08-23 MED ORDER — LIDOCAINE 2% (20 MG/ML) 5 ML SYRINGE
INTRAMUSCULAR | Status: DC | PRN
  Administered 2023-08-23: 100 mg via INTRAVENOUS

## 2023-08-23 MED ORDER — LIDOCAINE 2% (20 MG/ML) 5 ML SYRINGE
INTRAMUSCULAR | Status: AC
  Filled 2023-08-23: qty 5

## 2023-08-23 MED ORDER — ROCURONIUM BROMIDE 10 MG/ML (PF) SYRINGE
PREFILLED_SYRINGE | INTRAVENOUS | Status: DC | PRN
  Administered 2023-08-23: 50 mg via INTRAVENOUS

## 2023-08-23 MED ORDER — GLYCOPYRROLATE PF 0.2 MG/ML IJ SOSY
PREFILLED_SYRINGE | INTRAMUSCULAR | Status: AC
  Filled 2023-08-23: qty 1

## 2023-08-23 MED ORDER — OXYCODONE HCL 5 MG/5ML PO SOLN: 5.0000 mg | Freq: Once | ORAL | Status: DC | PRN

## 2023-08-23 MED ORDER — TRAMADOL HCL 50 MG PO TABS: 50.0000 mg | ORAL_TABLET | Freq: Four times a day (QID) | ORAL | 0 refills | Status: AC | PRN

## 2023-08-23 MED ORDER — MIDAZOLAM HCL 2 MG/2ML IJ SOLN
INTRAMUSCULAR | Status: DC | PRN
  Administered 2023-08-23: 2 mg via INTRAVENOUS

## 2023-08-23 MED ORDER — FENTANYL CITRATE (PF) 250 MCG/5ML IJ SOLN
INTRAMUSCULAR | Status: DC | PRN
  Administered 2023-08-23: 50 ug via INTRAVENOUS

## 2023-08-23 MED ORDER — CHLORHEXIDINE GLUCONATE 0.12 % MT SOLN: 15.0000 mL | Freq: Once | OROMUCOSAL | Status: AC

## 2023-08-23 MED ORDER — LACTATED RINGERS IV SOLN: INTRAVENOUS | Status: DC

## 2023-08-23 MED ORDER — OXYCODONE HCL 5 MG PO TABS: 5.0000 mg | ORAL_TABLET | Freq: Once | ORAL | Status: DC | PRN

## 2023-08-23 MED ORDER — PHENYLEPHRINE 80 MCG/ML (10ML) SYRINGE FOR IV PUSH (FOR BLOOD PRESSURE SUPPORT)
PREFILLED_SYRINGE | INTRAVENOUS | Status: AC
  Filled 2023-08-23: qty 10

## 2023-08-23 MED ORDER — DEXAMETHASONE SODIUM PHOSPHATE 10 MG/ML IJ SOLN
INTRAMUSCULAR | Status: AC
  Filled 2023-08-23: qty 1

## 2023-08-23 MED ORDER — ACETAMINOPHEN 500 MG PO TABS
ORAL_TABLET | ORAL | Status: AC
  Administered 2023-08-23: 1000 mg via ORAL
  Filled 2023-08-23: qty 2

## 2023-08-23 MED ORDER — PROPOFOL 10 MG/ML IV BOLUS
INTRAVENOUS | Status: DC | PRN
  Administered 2023-08-23: 110 mg via INTRAVENOUS

## 2023-08-23 MED ORDER — ACETAMINOPHEN 500 MG PO TABS: 1000.0000 mg | ORAL_TABLET | ORAL | Status: AC

## 2023-08-23 MED ORDER — BUPIVACAINE-EPINEPHRINE 0.25% -1:200000 IJ SOLN
INTRAMUSCULAR | Status: DC | PRN
  Administered 2023-08-23: 7 mL

## 2023-08-23 MED ORDER — MEPERIDINE HCL 25 MG/ML IJ SOLN: 6.2500 mg | INTRAMUSCULAR | Status: DC | PRN

## 2023-08-23 MED ORDER — MIDAZOLAM HCL 2 MG/2ML IJ SOLN
INTRAMUSCULAR | Status: AC
  Filled 2023-08-23: qty 2

## 2023-08-23 MED ORDER — BUPIVACAINE-EPINEPHRINE (PF) 0.25% -1:200000 IJ SOLN
INTRAMUSCULAR | Status: AC
  Filled 2023-08-23: qty 30

## 2023-08-23 MED ORDER — CHLORHEXIDINE GLUCONATE 0.12 % MT SOLN
OROMUCOSAL | Status: AC
  Administered 2023-08-23: 15 mL via OROMUCOSAL
  Filled 2023-08-23: qty 15

## 2023-08-23 MED ORDER — FENTANYL CITRATE (PF) 100 MCG/2ML IJ SOLN: 25.0000 ug | INTRAMUSCULAR | Status: DC | PRN

## 2023-08-23 MED ORDER — FENTANYL CITRATE (PF) 250 MCG/5ML IJ SOLN
INTRAMUSCULAR | Status: AC
  Filled 2023-08-23: qty 5

## 2023-08-23 MED ORDER — 0.9 % SODIUM CHLORIDE (POUR BTL) OPTIME
TOPICAL | Status: DC | PRN
  Administered 2023-08-23: 1000 mL

## 2023-08-23 MED ORDER — ONDANSETRON HCL 4 MG/2ML IJ SOLN
INTRAMUSCULAR | Status: DC | PRN
Start: 2023-08-23 — End: 2023-08-23
  Administered 2023-08-23: 4 mg via INTRAVENOUS

## 2023-08-23 SURGICAL SUPPLY — 21 items
CANISTER SUCT 3000ML PPV (MISCELLANEOUS) IMPLANT
COVER SURGICAL LIGHT HANDLE (MISCELLANEOUS) ×3 IMPLANT
DRAPE LAPAROTOMY 100X72 PEDS (DRAPES) IMPLANT
ELECTRODE REM PT RTRN 9FT ADLT (ELECTROSURGICAL) ×3 IMPLANT
GAUZE SPONGE 4X4 12PLY STRL (GAUZE/BANDAGES/DRESSINGS) ×3 IMPLANT
GLOVE SURG SIGNA 7.5 PF LTX (GLOVE) ×3 IMPLANT
GOWN STRL REUS W/ TWL LRG LVL3 (GOWN DISPOSABLE) ×3 IMPLANT
GOWN STRL REUS W/ TWL XL LVL3 (GOWN DISPOSABLE) ×3 IMPLANT
KIT BASIN OR (CUSTOM PROCEDURE TRAY) ×3 IMPLANT
KIT TURNOVER KIT B (KITS) ×3 IMPLANT
NDL HYPO 25GX1X1/2 BEV (NEEDLE) ×3 IMPLANT
NEEDLE HYPO 25GX1X1/2 BEV (NEEDLE) ×3 IMPLANT
NS IRRIG 1000ML POUR BTL (IV SOLUTION) ×3 IMPLANT
PACK GENERAL/GYN (CUSTOM PROCEDURE TRAY) ×3 IMPLANT
PAD ARMBOARD POSITIONER FOAM (MISCELLANEOUS) ×3 IMPLANT
SPECIMEN JAR SMALL (MISCELLANEOUS) ×3 IMPLANT
SUT MNCRL AB 4-0 PS2 18 (SUTURE) ×3 IMPLANT
SUT PROLENE 2 0 SH 30 (SUTURE) IMPLANT
SUT VIC AB 3-0 SH 27XBRD (SUTURE) ×3 IMPLANT
SYR CONTROL 10ML LL (SYRINGE) ×3 IMPLANT
TOWEL GREEN STERILE FF (TOWEL DISPOSABLE) ×3 IMPLANT

## 2023-08-23 NOTE — Anesthesia Postprocedure Evaluation (Signed)
 Anesthesia Post Note  Patient: Eric Day  Procedure(s) Performed: EXCISION, MASS, NECK (Neck) EXCISION, MASS, UPPER EXTREMITY (Head) EXCISION, LESION, BACK (Left: Back)     Patient location during evaluation: PACU Anesthesia Type: General Level of consciousness: awake and alert Pain management: pain level controlled Vital Signs Assessment: post-procedure vital signs reviewed and stable Respiratory status: spontaneous breathing, nonlabored ventilation, respiratory function stable and patient connected to nasal cannula oxygen Cardiovascular status: blood pressure returned to baseline and stable Postop Assessment: no apparent nausea or vomiting Anesthetic complications: no   No notable events documented.  Last Vitals:  Vitals:   08/23/23 1633 08/23/23 1634  BP:    Pulse: 74 72  Resp: 14 14  Temp:  (!) 36.4 C  SpO2: 96% 96%    Last Pain:  Vitals:   08/23/23 1634  TempSrc:   PainSc: 0-No pain                 Satina Jerrell

## 2023-08-23 NOTE — Transfer of Care (Signed)
 Immediate Anesthesia Transfer of Care Note  Patient: Eric Day  Procedure(s) Performed: EXCISION, MASS, NECK (Neck) EXCISION, MASS, UPPER EXTREMITY (Head) EXCISION, LESION, BACK (Left: Back)  Patient Location: PACU  Anesthesia Type:General  Level of Consciousness: drowsy  Airway & Oxygen Therapy: Patient Spontanous Breathing and Patient connected to face mask oxygen  Post-op Assessment: Report given to RN and Post -op Vital signs reviewed and stable  Post vital signs: Reviewed and stable  Last Vitals:  Vitals Value Taken Time  BP 134/95 08/23/23 1523  Temp 36.7 C 08/23/23 1523  Pulse 79 08/23/23 1528  Resp 7 08/23/23 1528  SpO2 98 % 08/23/23 1528  Vitals shown include unfiled device data.  Last Pain:  Vitals:   08/23/23 1243  TempSrc:   PainSc: 0-No pain         Complications: No notable events documented.

## 2023-08-23 NOTE — Discharge Instructions (Signed)
 You may shower starting tomorrow  May return to work this coming Friday if you want to.  If you need any notes for work, please call our office at [phone]  No real vigorous activity for 1 week  Tylenol  and ibuprofen also for pain

## 2023-08-23 NOTE — Anesthesia Preprocedure Evaluation (Addendum)
 Anesthesia Evaluation  Patient identified by MRN, date of birth, ID band Patient awake    Reviewed: Allergy & Precautions, NPO status , Patient's Chart, lab work & pertinent test results  History of Anesthesia Complications Negative for: history of anesthetic complications  Airway Mallampati: II  TM Distance: >3 FB Neck ROM: Full    Dental  (+) Edentulous Upper, Missing, Dental Advisory Given,    Pulmonary Current Smoker   Pulmonary exam normal        Cardiovascular hypertension, Pt. on medications + Past MI (2008)  Normal cardiovascular exam+ dysrhythmias      Neuro/Psych    Depression    Neurofibromatosis    GI/Hepatic   Endo/Other  Recurrent pheochromocytoma, s/p unilateral adrenalectomy, currently asymptomatic  Renal/GU   negative genitourinary   Musculoskeletal  (+) Arthritis ,    Abdominal   Peds  Hematology negative hematology ROS (+)   Anesthesia Other Findings   Reproductive/Obstetrics                             Anesthesia Physical Anesthesia Plan  ASA: 3  Anesthesia Plan: General   Post-op Pain Management: Tylenol  PO (pre-op)*   Induction: Intravenous  PONV Risk Score and Plan: 1 and Treatment may vary due to age or medical condition, Midazolam , Dexamethasone  and Ondansetron   Airway Management Planned: Oral ETT  Additional Equipment: None  Intra-op Plan:   Post-operative Plan: Extubation in OR  Informed Consent: I have reviewed the patients History and Physical, chart, labs and discussed the procedure including the risks, benefits and alternatives for the proposed anesthesia with the patient or authorized representative who has indicated his/her understanding and acceptance.     Dental advisory given  Plan Discussed with: CRNA  Anesthesia Plan Comments: (PAT note by Rudy Costain, PA-C:  Follows with cardiology for history of prior type II MI with normal  coronaries by catheterization 2006, right bundle branch block, hypertension, hyperlipidemia, pheochromocytoma (primarily followed by endocrinology at [facility]  Seen by Marlana Silvan, NP 08/12/2022 for preop evaluation.  Per note, "According to the Revised Cardiac Risk Index (RCRI), his Perioperative Risk of Major Cardiac Event is (%): 0.9. His Functional Capacity in METs is: 7.99 according to the Duke Activity Status Index (DASI). Therefore, based on ACC/AHA guidelines, patient would be at acceptable risk for the planned procedure without further cardiovascular testing. The patient was advised that if he develops new symptoms prior to surgery to contact our office to arrange for a follow-up visit, and he verbalized understanding."  Follows with [facility]ocrinology at [facility] for history of neurofibromatosis with recurrent pheochromocytoma.  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  Most recently underwent right adrenalectomy and resection of small bowel/mesenteric mass 04/2019.  Plasma free metanephrines continue to be high after surgery but better than presurgery.  He was not recommended to undergo bilateral complete adrenalectomy as it would render him lifelong adrenal insufficiency which was felt to be more challenging riskier in his case given multiple disabilities and limited support system.  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  He is maintained on amlodipine  5 mg daily and carvedilol  3.125 mg twice daily to cover his residual pheochromocytoma.  Patient will need day of surgery labs and evaluation.  EKG 05/25/2022: Sinus rhythm with sinus arrhythmia with frequent PVCs.  Rate 93.  Biatrial enlargement.  Right bundle branch block.  Inferior infarct, age undetermined.  Nuclear stress 01/05/2022:   Findings are consistent with prior myocardial  infarction with peri-infarct ischemia. The study is intermediate risk.   No ST deviation was noted.   LV perfusion is abnormal. There is evidence of ischemia. There is evidence of infarction. Defect 1: There is a medium defect with mild reduction in uptake present in the apical to basal inferior and inferolateral location(s) that is partially reversible. There is abnormal wall motion in the defect area. Consistent with infarction and peri-infarct ischemia.   Left ventricular function is abnormal. Nuclear stress EF: 45 %. The left ventricular ejection fraction is mildly decreased (45-54%). End diastolic cavity size is normal. End systolic cavity size is normal.   Prior study not available for comparison.  1. Partially reversible basal to apical inferior/inferolateral perfusion defect with abnormal wall motion, consistent with infarct with peri-infarct ischemia 2. Mild systolic dysfunction, EF 45% 3. Intermediate risk study  TTE 01/05/2022: 1. Left ventricular ejection fraction, by estimation, is 50 to 55%. The  left ventricle has low normal function. The left ventricle has no regional  wall motion abnormalities. Left ventricular diastolic parameters are  consistent with Grade I diastolic  dysfunction (impaired relaxation).  2. Right ventricular systolic function is normal. The right ventricular  size is normal. There is normal pulmonary artery systolic pressure. The  estimated right ventricular systolic pressure is 26.8 mmHg.  3. The mitral valve is normal in structure. Trivial mitral valve  regurgitation. No evidence of mitral stenosis.  4. The aortic valve is tricuspid. Aortic valve regurgitation is not  visualized. No aortic stenosis is present.  5. The inferior vena cava is normal in size with greater than 50%  respiratory variability, suggesting right atrial pressure of 3 mmHg.   )        Anesthesia Quick Evaluation

## 2023-08-23 NOTE — Anesthesia Procedure Notes (Signed)
 Procedure Name: Intubation Date/Time: 08/23/2023 2:30 PM  Performed by: Jode Lippe J, CRNAPre-anesthesia Checklist: Patient identified, Emergency Drugs available, Suction available and Patient being monitored Patient Re-evaluated:Patient Re-evaluated prior to induction Oxygen Delivery Method: Circle System Utilized Preoxygenation: Pre-oxygenation with 100% oxygen Induction Type: IV induction Ventilation: Mask ventilation without difficulty Laryngoscope Size: Glidescope and 4 Grade View: Grade I Tube type: Oral Tube size: 7.5 mm Number of attempts: 1 Airway Equipment and Method: Stylet and Oral airway Placement Confirmation: ETT inserted through vocal cords under direct vision, positive ETCO2 and breath sounds checked- equal and bilateral Secured at: 23 cm Tube secured with: Tape Dental Injury: Teeth and Oropharynx as per pre-operative assessment

## 2023-08-23 NOTE — Op Note (Signed)
 EXCISION, MASS, NECK, EXCISION, MASS, UPPER EXTREMITY, EXCISION, LESION, BACK  Procedure Note  ESTEL LINDSKOG 08/23/2023   Pre-op Diagnosis: NEUROFIBROMATOSIS     Post-op Diagnosis: same  Procedure(s): EXCISION NEUROFIBROMAS ON SCALP X 1, NECK, AND LEFT UPPER BACK  Surgeon(s): Oza Blumenthal, MD  Anesthesia: Monitor Anesthesia Care  Staff:  Circulator: Alphia Jasmine, RN Relief Circulator: Audrie Leatherwood, RN Relief Scrub: Cannon Champion, RN Scrub Person: Yuvonne Herald E  Estimated Blood Loss: Minimal               Specimens: SENT TO PATH         Indications: This is a 63 year old gentleman who is blind who has a significant history of neurofibromatosis.  He has had multiple neurofibromas removed from his body in multiple locations over the years.  Currently has 2 on his scalp that are causing significant discomfort especially wearing a hat as well as 1 on his neck and 1 on the left upper back which is causing discomfort.  He still has extensive neurofibromas on the rest of his body but these are the only ones he desires to have removed currently as they are symptomatic.  Procedure: The patient was brought to the operating room and identified the correct patient.  He was placed upon the operating table and general anesthesia was induced.  He was next placed into the right lateral decubitus position.  His scalp, neck, and upper back were then prepped and draped in usual sterile fashion.  I anesthetized all skin lesions with Marcaine .  I next made an incision on the palpable mass that was subcutaneous on the upper back.  I dissected down to the subcutaneous tissue with the cautery and identified the neurofibroma.  It was at least 3 cm in size and I excised it in entirety with a scalpel.  I then lifted up the single 1 cm fibroma on his neck which was posterior as well and excised at the base with the cautery.  Using a scalpel I then excised a 3 cm as well as a 2 cm neurofibroma on the  back of the scalp.  All of these were sent together to pathology for evaluation.  I achieved hemostasis with cautery.  I closed the 2 scalp lesions with interrupted 3-0 Prolene sutures.  I closed the upper back incision with 3-0 Vicryl sutures and interrupted 3-0 Prolene sutures as well.  The other small wound from the neck did not require closure.  Dry gauze was then applied on the back.  The patient tolerated the procedure well.  All the counts were correct at the end of the procedure.  He was then placed back into a supine position.  He was then extubated in the operating room and taken in a stable condition to the recovery room. Oza Blumenthal   Date: 08/23/2023  Time: 3:08 PM

## 2023-08-23 NOTE — Interval H&P Note (Signed)
 History and Physical Interval Note:no change in H and P  08/23/2023 1:20 PM  Eric Day  has presented today for surgery, with the diagnosis of NEUROFIBROMATOSIS.  The various methods of treatment have been discussed with the patient and family. After consideration of risks, benefits and other options for treatment, the patient has consented to  Procedure(s) with comments: EXCISION, MASS, NECK (N/A) - EXCISION NEUROFIBROMAS POSTERIOR SCALP X2, POSTERIOR NECK, LEFT UPPER BACK EXCISION, MASS, UPPER EXTREMITY (N/A) EXCISION, LESION, BACK (Left) as a surgical intervention.  The patient's history has been reviewed, patient examined, no change in status, stable for surgery.  I have reviewed the patient's chart and labs.  Questions were answered to the patient's satisfaction.     Oza Blumenthal

## 2023-08-24 ENCOUNTER — Encounter (HOSPITAL_COMMUNITY): Payer: Self-pay | Admitting: Surgery

## 2023-08-25 LAB — SURGICAL PATHOLOGY

## 2023-09-28 ENCOUNTER — Encounter: Payer: Self-pay | Admitting: Cardiovascular Disease

## 2023-11-12 NOTE — Progress Notes (Unsigned)
 Office Visit    Patient Name: Eric Day Date of Encounter: 11/14/2023  PCP:  Seabron Lenis, MD   [facility]  Cardiologist:  Georganna Archer, MD  Advanced Practice Provider:  No care team member to display Electrophysiologist:  None   HPI    Eric Day is a 63 y.o. 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  Was last seen 12/2021 and at that time he was not having any angina.  A Lexiscan  Myoview  was ordered for further evaluation of his right bundle branch block.  He was seen by me February of last year, he states he is not having any chest pain.  He does have some chest tightness occasionally when laying down and watching TV.  Nonexertional.  He usually takes Tylenol  and it resolves.  Nothing makes it worse.  Usually last for a few minutes.  He does occasionally have shortness of breath but he smokes cigarettes.  He has cut back a lot. 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  We discussed starting him back on his Coreg  at a low dose.  He is having frequent PVCs today on EKG.   No edema, orthopnea, PND. Reports no palpitations.   Today, he  experiences intermittent chest pain, primarily in the early morning upon waking and occasionally during work. The pain is described as a 'tight feeling' in the chest that requires him to slow down and sit on the bed. It does not radiate to the arm and is relieved by stretching his back. There is no associated shortness of breath or difficulty breathing during these episodes. He has not used nitroglycerin for the chest pain and takes Tylenol  sparingly due to adrenal concerns.  He recalls being informed of  mild leaks in his mitral and tricuspid valves. He had a myocardial infarction in 2012, with no new heart attacks indicated in a previous stress test. His current medications include carvedilol  and amlodipine , which he resumed after a brief period on valsartan . He is not currently on cholesterol medication, although his LDL was noted to be higher than desired in March.  Reports no shortness of breath nor dyspnea on exertion. No edema, orthopnea, PND. Reports no palpitations.   Discussed the use of AI scribe software for clinical note transcription with the patient, who gave verbal consent to proceed.   Past Medical History    Past Medical History:  Diagnosis Date   Arthritis    Colon cancer (HCC)    prostate; history of stomach cancer    Colon cancer (HCC)    Depression    ED (erectile dysfunction)    Eye disorder    rt artificial   Hard of hearing    bilat    Hypertension    Legally blind    Multiple falls    Myocardial infarction [facility])    2008   Neurofibromatosis (HCC)    Nocturia    Prosthetic eye globe    right eye    Shortness of breath dyspnea    walking distances or running    Tuberculosis    70's   Wears glasses    Past Surgical History:  Procedure Laterality Date   ABDOMINAL SURGERY  90's   stomach?   ANTERIOR CERVICAL DECOMP/DISCECTOMY FUSION N/A 07/07/2014   Procedure: ANTERIOR CERVICAL DECOMPRESSION/DISCECTOMY FUSION CERVICAL THREE-FOUR;  Surgeon: Arley Helling, MD;  Location: [facility];  Service: Neurosurgery;  Laterality: N/A;   CARDIAC CATHETERIZATION     EXCISION MASS NECK N/A 08/23/2023   Procedure: EXCISION, MASS, NECK;  Surgeon: Vernetta Berg, MD;  Location: [facility] OR;  Service: General;  Laterality: N/A;  EXCISION NEUROFIBROMAS POSTERIOR SCALP X2, POSTERIOR NECK, LEFT UPPER BACK   EXCISION OF BACK LESION Left 08/23/2023   Procedure: EXCISION, LESION, BACK;  Surgeon: Vernetta Berg, MD;  Location: [facility] OR;  Service: General;  Laterality: Left;   EXCISION,  MASS, UPPER EXTREMITY N/A 08/23/2023   Procedure: EXCISION, MASS, UPPER EXTREMITY;  Surgeon: Vernetta Berg, MD;  Location: [facility] OR;  Service: General;  Laterality: N/A;   EYE SURGERY  90's   HERNIA REPAIR     hernia as child   ORIF ORBITAL FRACTURE Right 08/19/2022   Procedure: ORBITAL RECONSTRUCTION;  Surgeon: Rodgers Faden, MD;  Location[facility] OR;  Service: Ophthalmology;  Laterality: Right;   ROBOT ASSISTED LAPAROSCOPIC RADICAL PROSTATECTOMY N/A 05/07/2015   Procedure: ROBOTIC ASSISTED LAPAROSCOPIC RADICAL PROSTATECTOMY  LEVEL 3 LYSIS OF ADHESIONS/SMALL BOWEL RESECTION AND ANASTAMOSIS;  Surgeon: Gretel Ferrara, MD;  Location: [facility] ORS;  Service: Urology;  Laterality: N/A;   SKIN BIOPSY Right 08/19/2022   Procedure: RIGHT ANTERIOR ORBITOTOMY WITH BIOPSY;  Surgeon: Rodgers Faden, MD;  Location: [facility] OR;  Service: Ophthalmology;  Laterality: Right;   SKIN FULL THICKNESS GRAFT Right 08/19/2022   Procedure: Dermis Fat Graft from Right Upper Leg;  Surgeon: Rodgers Faden, MD;  Location: [facility] OR;  Service: Ophthalmology;  Laterality: Right;    Allergies  Allergies  Allergen Reactions   Other     Patient cannot take any steroids per endocrinologist due to adrenal glad issue      EKGs/Labs/Other Studies Reviewed:   The following studies were reviewed today:  Lexiscan  Myoview  01/05/2022  Study Highlights      Findings are consistent with prior myocardial infarction with peri-infarct ischemia. The study is intermediate risk.   No ST deviation was noted.   LV perfusion is abnormal. There is evidence of ischemia. There is evidence of infarction. Defect 1: There is a medium defect with mild reduction in uptake present in the apical to basal inferior and inferolateral location(s) that is partially reversible. There is abnormal wall motion in the defect area. Consistent with infarction and peri-infarct ischemia.   Left ventricular function is abnormal. Nuclear stress EF: 45 %. The left ventricular  ejection fraction is mildly decreased (45-54%). End diastolic cavity size is normal. End systolic cavity size is normal.   Prior study not available for comparison.   Partially reversible basal to apical inferior/inferolateral perfusion defect with abnormal wall motion, consistent with infarct with peri-infarct ischemia Mild systolic dysfunction, EF 45% Intermediate risk study   EKG:  EKG is  ordered today.  The ekg ordered today demonstrates normal sinus rhythm with frequent PVCs, 93 bpm, right bundle branch block (old), biatrial enlargement  Recent Labs: No results found for requested labs within last 365 days.  Recent Lipid Panel No results found for: CHOL, TRIG, HDL, CHOLHDL, VLDL, LDLCALC, LDLDIRECT   Home Medications   Current Meds  Medication Sig   amLODipine  (NORVASC ) 2.5 MG tablet Take 1 tablet (2.5 mg total) by mouth daily.   Calcium Carbonate (CALCIUM 500 PO) Take 1 tablet by mouth 3 (three) times a week.   calcium  carbonate (TUMS - DOSED IN MG ELEMENTAL CALCIUM) 500 MG chewable tablet Chew 1 tablet by mouth as needed for indigestion or heartburn.   carvedilol  (COREG ) 6.25 MG tablet Take 1 tablet (6.25 mg total) by mouth 2 (two) times daily.   fludrocortisone (FLORINEF) 0.1 MG tablet Take 0.1 mg by mouth daily.   hydrocortisone (CORTEF) 5 MG tablet Take 5 mg by mouth 2 (two) times daily.   Vitamin D, Ergocalciferol, (DRISDOL) 1.25 MG (50000 UNIT) CAPS capsule Take 50,000 Units by mouth every 7 (seven) days.     Review of Systems      All other systems reviewed and are otherwise negative except as noted above.  Physical Exam    VS:  BP 104/68 (BP Location: Left Arm, Patient Position: Sitting, Cuff Size: Normal)   Pulse 77   Ht 5' 6 (1.676 m)   Wt 193 lb 6.4 oz (87.7 kg)   SpO2 95%   BMI 31.22 kg/m  , BMI Body mass index is 31.22 kg/m.  Wt Readings from Last 3 Encounters:  11/14/23 193 lb 6.4 oz (87.7 kg)  08/23/23 195 lb (88.5 kg)  11/01/22 193  lb 9.6 oz (87.8 kg)     GEN: Well nourished, well developed, in no acute distress. HEENT: normal. Neck: Supple, no JVD, carotid bruits, or masses. Cardiac: irregular (likely frequent PVCs), no murmurs, rubs, or gallops. No clubbing, cyanosis, edema.  Radials/PT 2+ and equal bilaterally.  Respiratory:  Respirations regular and unlabored, clear to auscultation bilaterally. GI: Soft, nontender, nondistended. MS: No deformity or atrophy. Skin: Warm and dry, no rash. Neuro:  Strength and sensation are intact. Psych: Normal affect.  Assessment & Plan    Intermittent chest pain in patient with history of myocardial infarction Intermittent chest pain. Previous stress test showed old myocardial infarction changes, no new infarction. - Order LexiScan  Myoview  stress test to evaluate current chest pain and compare with previous results. - Consider long-acting nitroglycerin if stress test indicates changes.  Mild mitral and tricuspid valve regurgitation Echocardiogram shows mild regurgitation. No symptoms or contribution to current chest pain.  Hypertension Blood pressure well-controlled with carvedilol  and amlodipine . - Continue current medications: carvedilol  and amlodipine .  Hyperlipidemia LDL cholesterol slightly elevated. Plan to discuss non-statin options with endocrinologist due to adrenal concerns. - Discuss cholesterol medication options with endocrinologist during upcoming virtual appointment.  Right bundle branch block -chronic -ischemic workup back in Sept -continue current medication regimen  Pheochromocytoma -followed at [facility] -removal of one adrenal gland      Disposition: Follow up 6 months with Georganna Archer, MD or APP.  Signed, Orren LOISE Fabry, PA-C 11/14/2023, 1:00 PM [facility]

## 2023-11-14 ENCOUNTER — Encounter: Payer: Self-pay | Admitting: *Deleted

## 2023-11-14 ENCOUNTER — Encounter: Payer: Self-pay | Admitting: Physician Assistant

## 2023-11-14 ENCOUNTER — Ambulatory Visit: Attending: Physician Assistant | Admitting: Physician Assistant

## 2023-11-14 VITALS — BP 104/68 | HR 77 | Ht 66.0 in | Wt 193.4 lb

## 2023-11-14 DIAGNOSIS — E785 Hyperlipidemia, unspecified: Secondary | ICD-10-CM | POA: Diagnosis not present

## 2023-11-14 DIAGNOSIS — D3500 Benign neoplasm of unspecified adrenal gland: Secondary | ICD-10-CM | POA: Insufficient documentation

## 2023-11-14 DIAGNOSIS — I252 Old myocardial infarction: Secondary | ICD-10-CM | POA: Diagnosis present

## 2023-11-14 DIAGNOSIS — R072 Precordial pain: Secondary | ICD-10-CM | POA: Insufficient documentation

## 2023-11-14 DIAGNOSIS — I10 Essential (primary) hypertension: Secondary | ICD-10-CM | POA: Diagnosis not present

## 2023-11-14 DIAGNOSIS — I4510 Unspecified right bundle-branch block: Secondary | ICD-10-CM | POA: Diagnosis not present

## 2023-11-14 DIAGNOSIS — Z79899 Other long term (current) drug therapy: Secondary | ICD-10-CM | POA: Diagnosis present

## 2023-11-14 NOTE — Patient Instructions (Addendum)
 Testing/Procedures: Your physician has requested that you have a lexiscan  myoview . For further information please visit [email or web address] Please follow instruction sheet, as given.   Follow-Up:  Your next appointment:   6 month(s)  Provider:   Georganna Archer, MD    Other Instructions:  Please ask your endocrinologist if taking a low dose statin such as Crestor is okay to add to your medication routine.

## 2023-11-15 ENCOUNTER — Encounter: Payer: Self-pay | Admitting: Physician Assistant

## 2023-11-15 DIAGNOSIS — E785 Hyperlipidemia, unspecified: Secondary | ICD-10-CM

## 2023-11-17 ENCOUNTER — Telehealth (HOSPITAL_COMMUNITY): Payer: Self-pay | Admitting: *Deleted

## 2023-11-17 MED ORDER — ROSUVASTATIN CALCIUM 5 MG PO TABS: 5.0000 mg | ORAL_TABLET | Freq: Every day | ORAL | 3 refills | Status: AC

## 2023-11-17 NOTE — Telephone Encounter (Signed)
 Left a detailed message on voicemail as a reminder about his STRESS TEST on 11/24/23 at 12:45.

## 2023-11-23 ENCOUNTER — Other Ambulatory Visit: Payer: Self-pay | Admitting: Physician Assistant

## 2023-11-23 DIAGNOSIS — R072 Precordial pain: Secondary | ICD-10-CM

## 2023-11-24 ENCOUNTER — Ambulatory Visit (HOSPITAL_COMMUNITY)
Admission: RE | Admit: 2023-11-24 | Discharge: 2023-11-24 | Disposition: A | Discharge status: Home / Self Care | Source: Ambulatory Visit | Attending: Cardiovascular Disease | Admitting: Cardiovascular Disease

## 2023-11-24 DIAGNOSIS — R072 Precordial pain: Secondary | ICD-10-CM | POA: Diagnosis present

## 2023-11-24 MED ORDER — REGADENOSON 0.4 MG/5ML IV SOLN
INTRAVENOUS | Status: AC
  Filled 2023-11-24: qty 5

## 2023-11-24 MED ORDER — TECHNETIUM TC 99M TETROFOSMIN IV KIT
33.0000 | PACK | Freq: Once | INTRAVENOUS | Status: AC | PRN
  Administered 2023-11-24: 33 via INTRAVENOUS

## 2023-11-24 MED ORDER — REGADENOSON 0.4 MG/5ML IV SOLN
0.4000 mg | Freq: Once | INTRAVENOUS | Status: AC
  Administered 2023-11-24: 0.4 mg via INTRAVENOUS

## 2023-11-24 MED ORDER — TECHNETIUM TC 99M TETROFOSMIN IV KIT
9.2000 | PACK | Freq: Once | INTRAVENOUS | Status: AC | PRN
  Administered 2023-11-24: 9.2 via INTRAVENOUS

## 2023-11-25 LAB — MYOCARDIAL PERFUSION IMAGING
LV dias vol: 70 mL (ref 62–150)
LV sys vol: 28 mL (ref 4.2–5.8)
Nuc Stress EF: 60 %
Peak HR: 92 {beats}/min
Rest HR: 71 {beats}/min
Rest Nuclear Isotope Dose: 9.2 mCi
SDS: 2
SRS: 7
SSS: 8
ST Depression (mm): 0 mm
Stress Nuclear Isotope Dose: 33 mCi
TID: 0.95

## 2023-11-27 ENCOUNTER — Ambulatory Visit: Payer: Self-pay | Admitting: Physician Assistant

## 2023-12-01 ENCOUNTER — Ambulatory Visit: Payer: Self-pay | Admitting: Physician Assistant

## 2023-12-19 ENCOUNTER — Other Ambulatory Visit: Payer: Self-pay

## 2023-12-19 DIAGNOSIS — I10 Essential (primary) hypertension: Secondary | ICD-10-CM

## 2023-12-20 MED ORDER — CARVEDILOL 6.25 MG PO TABS: 6.2500 mg | ORAL_TABLET | Freq: Two times a day (BID) | ORAL | 3 refills | Status: AC

## 2024-01-26 ENCOUNTER — Ambulatory Visit: Admitting: Podiatry

## 2024-01-26 ENCOUNTER — Encounter: Payer: Self-pay | Admitting: Podiatry

## 2024-01-26 ENCOUNTER — Ambulatory Visit (INDEPENDENT_AMBULATORY_CARE_PROVIDER_SITE_OTHER): Admitting: Podiatry

## 2024-01-26 DIAGNOSIS — B351 Tinea unguium: Secondary | ICD-10-CM | POA: Diagnosis not present

## 2024-01-26 DIAGNOSIS — M79674 Pain in right toe(s): Secondary | ICD-10-CM

## 2024-01-26 DIAGNOSIS — M79675 Pain in left toe(s): Secondary | ICD-10-CM | POA: Diagnosis not present

## 2024-01-26 NOTE — Progress Notes (Signed)
  Subjective:  Patient ID: Eric Day Teresa Mickey., male    DOB: September 26, 1960,  MRN: [id number]  Chief Complaint  Patient presents with   Nail Problem    Nail trim    63 y.o. male returns for the above complaint.  Patient presents with thick Additionally mycotic toenails x 10 mild pain on palpation worse with ambulation and shoe pressure patient would like me debride out his diabetic himself denies any other acute issues.  Objective:  There were no vitals filed for this visit. Podiatric Exam: Vascular: dorsalis pedis and posterior tibial pulses are palpable bilateral. Capillary return is immediate. Temperature gradient is WNL. Skin turgor WNL  Sensorium: Normal Semmes Weinstein monofilament test. Normal tactile sensation bilaterally. Nail Exam: Pt has thick disfigured discolored nails with subungual debris noted bilateral entire nail hallux through fifth toenails.  Pain on palpation to the nails. Ulcer Exam: There is no evidence of ulcer or pre-ulcerative changes or infection. Orthopedic Exam: Muscle tone and strength are WNL. No limitations in general ROM. No crepitus or effusions noted.  Skin: No Porokeratosis. No infection or ulcers    Assessment & Plan:   1. Pain due to onychomycosis of toenails of both feet     Patient was evaluated and treated and all questions answered.  Onychomycosis with pain  -Nails palliatively debrided as below. -Educated on self-care  Procedure: Nail Debridement Rationale: pain  Type of Debridement: manual, sharp debridement. Instrumentation: Nail nipper, rotary burr. Number of Nails: 10  Procedures and Treatment: Consent by patient was obtained for treatment procedures. The patient understood the discussion of treatment and procedures well. All questions were answered thoroughly reviewed. Debridement of mycotic and hypertrophic toenails, 1 through 5 bilateral and clearing of subungual debris. No ulceration, no infection noted.  Return Visit-Office  Procedure: Patient instructed to return to the office for a follow up visit 3 months for continued evaluation and treatment.  Franky Blanch, DPM    Return in about 3 months (around 04/27/2024) for RFC.

## 2024-04-25 ENCOUNTER — Encounter: Payer: Self-pay | Admitting: Physician Assistant

## 2024-04-29 ENCOUNTER — Ambulatory Visit: Admitting: Podiatry
